# Patient Record
Sex: Female | Born: 1985 | Race: White | Hispanic: No | Smoking: Never smoker
Health system: Southern US, Community
[De-identification: ages and names within clinical notes are randomized; demographics above are authoritative.]

## PROBLEM LIST (undated history)

## (undated) ENCOUNTER — Inpatient Hospital Stay (HOSPITAL_COMMUNITY): Payer: Self-pay

## (undated) DIAGNOSIS — R51 Headache: Secondary | ICD-10-CM

## (undated) DIAGNOSIS — I809 Phlebitis and thrombophlebitis of unspecified site: Secondary | ICD-10-CM

## (undated) DIAGNOSIS — E039 Hypothyroidism, unspecified: Secondary | ICD-10-CM

## (undated) DIAGNOSIS — R519 Headache, unspecified: Secondary | ICD-10-CM

## (undated) DIAGNOSIS — J811 Chronic pulmonary edema: Secondary | ICD-10-CM

## (undated) DIAGNOSIS — I10 Essential (primary) hypertension: Secondary | ICD-10-CM

## (undated) DIAGNOSIS — O1490 Unspecified pre-eclampsia, unspecified trimester: Secondary | ICD-10-CM

---

## 2008-07-06 DIAGNOSIS — O1490 Unspecified pre-eclampsia, unspecified trimester: Secondary | ICD-10-CM

## 2008-07-06 DIAGNOSIS — J810 Acute pulmonary edema: Secondary | ICD-10-CM

## 2014-08-16 ENCOUNTER — Emergency Department: Payer: Self-pay | Admitting: Emergency Medicine

## 2015-05-31 ENCOUNTER — Ambulatory Visit: Payer: 59 | Admitting: Family Medicine

## 2015-07-10 ENCOUNTER — Emergency Department
Admission: EM | Admit: 2015-07-10 | Discharge: 2015-07-10 | Disposition: A | Discharge status: Home / Self Care | Payer: Medicaid Other | Attending: Emergency Medicine | Admitting: Emergency Medicine

## 2015-07-10 ENCOUNTER — Encounter: Payer: Self-pay | Admitting: Emergency Medicine

## 2015-07-10 ENCOUNTER — Emergency Department: Payer: Medicaid Other

## 2015-07-10 DIAGNOSIS — O200 Threatened abortion: Secondary | ICD-10-CM | POA: Diagnosis not present

## 2015-07-10 DIAGNOSIS — O209 Hemorrhage in early pregnancy, unspecified: Secondary | ICD-10-CM | POA: Diagnosis present

## 2015-07-10 DIAGNOSIS — O10011 Pre-existing essential hypertension complicating pregnancy, first trimester: Secondary | ICD-10-CM | POA: Diagnosis not present

## 2015-07-10 DIAGNOSIS — Z3A01 Less than 8 weeks gestation of pregnancy: Secondary | ICD-10-CM | POA: Insufficient documentation

## 2015-07-10 HISTORY — DX: Essential (primary) hypertension: I10

## 2015-07-10 HISTORY — DX: Unspecified pre-eclampsia, unspecified trimester: O14.90

## 2015-07-10 LAB — URINALYSIS COMPLETE WITH MICROSCOPIC (ARMC ONLY)
BACTERIA UA: NONE SEEN
Bilirubin Urine: NEGATIVE
Glucose, UA: NEGATIVE mg/dL
Hgb urine dipstick: NEGATIVE
Ketones, ur: NEGATIVE mg/dL
Leukocytes, UA: NEGATIVE
Nitrite: NEGATIVE
PROTEIN: NEGATIVE mg/dL
Specific Gravity, Urine: 1.019 (ref 1.005–1.030)
pH: 5 (ref 5.0–8.0)

## 2015-07-10 LAB — COMPREHENSIVE METABOLIC PANEL
ALBUMIN: 3.8 g/dL (ref 3.5–5.0)
ALK PHOS: 42 U/L (ref 38–126)
ALT: 30 U/L (ref 14–54)
AST: 26 U/L (ref 15–41)
Anion gap: 7 (ref 5–15)
BUN: 7 mg/dL (ref 6–20)
CHLORIDE: 105 mmol/L (ref 101–111)
CO2: 26 mmol/L (ref 22–32)
CREATININE: 0.67 mg/dL (ref 0.44–1.00)
Calcium: 8.9 mg/dL (ref 8.9–10.3)
GFR calc non Af Amer: >60 mL/min (ref >60)
GLUCOSE: 99 mg/dL (ref 65–99)
Potassium: 3.9 mmol/L (ref 3.5–5.1)
SODIUM: 138 mmol/L (ref 135–145)
Total Bilirubin: 0.3 mg/dL (ref 0.3–1.2)
Total Protein: 6.9 g/dL (ref 6.5–8.1)

## 2015-07-10 LAB — CBC WITH DIFFERENTIAL/PLATELET
Basophils Absolute: 0.1 10*3/uL (ref 0–0.1)
Basophils Relative: 1 %
Eosinophils Absolute: 0.3 10*3/uL (ref 0–0.7)
Eosinophils Relative: 2 %
HEMATOCRIT: 40.3 % (ref 35.0–47.0)
HEMOGLOBIN: 12.9 g/dL (ref 12.0–16.0)
LYMPHS ABS: 3.3 10*3/uL (ref 1.0–3.6)
Lymphocytes Relative: 31 %
MCH: 24.4 pg — AB (ref 26.0–34.0)
MCHC: 32.1 g/dL (ref 32.0–36.0)
MCV: 76.1 fL — ABNORMAL LOW (ref 80.0–100.0)
MONO ABS: 0.5 10*3/uL (ref 0.2–0.9)
MONOS PCT: 5 %
NEUTROS ABS: 6.7 10*3/uL — AB (ref 1.4–6.5)
NEUTROS PCT: 61 %
Platelets: 339 10*3/uL (ref 150–440)
RBC: 5.29 MIL/uL — ABNORMAL HIGH (ref 3.80–5.20)
RDW: 16.1 % — AB (ref 11.5–14.5)
WBC: 11 10*3/uL (ref 3.6–11.0)

## 2015-07-10 LAB — HCG, QUANTITATIVE, PREGNANCY: HCG, BETA CHAIN, QUANT, S: 3949 m[IU]/mL — AB (ref <5)

## 2015-07-10 LAB — ABO/RH: ABO/RH(D): O POS

## 2015-07-10 MED ORDER — ACETAMINOPHEN 500 MG PO TABS
1000.0000 mg | ORAL_TABLET | Freq: Once | ORAL | Status: AC
  Administered 2015-07-10: 1000 mg via ORAL
  Filled 2015-07-10: qty 2

## 2015-07-10 NOTE — ED Notes (Signed)
Pt reports sharp LLQ abdominal pain with spotting that began yesterday. Pt states that her spotting "looks like my period". 3 positive pregnancy tests, last taken at end of November. Nausea, denies vomiting. Pt alert and oriented X4, active, cooperative, pt in NAD. RR even and unlabored, color WNL.

## 2015-07-10 NOTE — Discharge Instructions (Signed)
Please follow-up with the health Department next week for recheck. Return to the emergency department for any significant increase in vaginal bleeding, significant increase in pain, or any other symptom personally concerning to yourself.   Threatened Miscarriage A threatened miscarriage occurs when you have vaginal bleeding during your first 20 weeks of pregnancy but the pregnancy has not ended. If you have vaginal bleeding during this time, your health care provider will do tests to make sure you are still pregnant. If the tests show you are still pregnant and the developing baby (fetus) inside your womb (uterus) is still growing, your condition is considered a threatened miscarriage. A threatened miscarriage does not mean your pregnancy will end, but it does increase the risk of losing your pregnancy (complete miscarriage). CAUSES  The cause of a threatened miscarriage is usually not known. If you go on to have a complete miscarriage, the most common cause is an abnormal number of chromosomes in the developing baby. Chromosomes are the structures inside cells that hold all your genetic material. Some causes of vaginal bleeding that do not result in miscarriage include:  Having sex.  Having an infection.  Normal hormone changes of pregnancy.  Bleeding that occurs when an egg implants in your uterus. RISK FACTORS Risk factors for bleeding in early pregnancy include:  Obesity.  Smoking.  Drinking excessive amounts of alcohol or caffeine.  Recreational drug use. SIGNS AND SYMPTOMS  Light vaginal bleeding.  Mild abdominal pain or cramps. DIAGNOSIS  If you have bleeding with or without abdominal pain before 20 weeks of pregnancy, your health care provider will do tests to check whether you are still pregnant. One important test involves using sound waves and a computer (ultrasound) to create images of the inside of your uterus. Other tests include an internal exam of your vagina and  uterus (pelvic exam) and measurement of your baby's heart rate.  You may be diagnosed with a threatened miscarriage if:  Ultrasound testing shows you are still pregnant.  Your baby's heart rate is strong.  A pelvic exam shows that the opening between your uterus and your vagina (cervix) is closed.  Your heart rate and blood pressure are stable.  Blood tests confirm you are still pregnant. TREATMENT  No treatments have been shown to prevent a threatened miscarriage from going on to a complete miscarriage. However, the right home care is important.  HOME CARE INSTRUCTIONS   Make sure you keep all your appointments for prenatal care. This is very important.  Get plenty of rest.  Do not have sex or use tampons if you have vaginal bleeding.  Do not douche.  Do not smoke or use recreational drugs.  Do not drink alcohol.  Avoid caffeine. SEEK MEDICAL CARE IF:  You have light vaginal bleeding or spotting while pregnant.  You have abdominal pain or cramping.  You have a fever. SEEK IMMEDIATE MEDICAL CARE IF:  You have heavy vaginal bleeding.  You have blood clots coming from your vagina.  You have severe low back pain or abdominal cramps.  You have fever, chills, and severe abdominal pain. MAKE SURE YOU:  Understand these instructions.  Will watch your condition.  Will get help right away if you are not doing well or get worse.   This information is not intended to replace advice given to you by your health care provider. Make sure you discuss any questions you have with your health care provider.   Document Released: 07/14/2005 Document Revised: 07/19/2013 Document Reviewed:  05/10/2013 Elsevier Interactive Patient Education [employer]2016 Elsevier Inc.

## 2015-07-10 NOTE — ED Notes (Signed)
Pt states her last period was mid September, has had a previous ectopic preg in past. Concerned for the same.

## 2015-07-10 NOTE — ED Provider Notes (Signed)
[facility] Emergency Department Provider Note  Time seen: 10:16 AM  I have reviewed the triage vital signs and the nursing notes.   HISTORY  Chief Complaint Vaginal Bleeding    HPI Stephanie [name] is a 29 y.o. female G4 P2 A1 with a past medical history of hypertension, past history of preeclampsia, thyroid disease, who presents the emergency department with left lower quadrant abdominal pain and vaginal spotting. According to the patient her last period was approximately 2 months ago, she took 3 pregnancy tests at home starting at the end of November which have all been positive.Denies fever, nausea, vomiting, diarrhea, dysuria. States she had very slight vaginal spotting this morning. States a history of an ectopic pregnancy in the past. Describes her cramping left lower quads abdominal pain as mild currently.     Past Medical History  Diagnosis Date  . Ectopic pregnancy, tubal     left   . Hypertension   . Preeclampsia     There are no active problems to display for this patient.   Past Surgical History  Procedure Laterality Date  . Cesarean section      No current outpatient prescriptions on file.  Allergies Review of patient's allergies indicates no known allergies.  No family history on file.  Social History Social History  Substance Use Topics  . Smoking status: Never Smoker   . Smokeless tobacco: None  . Alcohol Use: No    Review of Systems Constitutional: Negative for fever. Cardiovascular: Negative for chest pain. Respiratory: Negative for shortness of breath. Gastrointestinal: Positive left lower quadrant abdominal pain. Negative for nausea, vomiting, diarrhea Genitourinary: Negative for dysuria. Positive for vaginal spotting Musculoskeletal: Negative for back pain. Skin: Negative for rash. Neurological: Negative for headache 10-point ROS otherwise negative.  ____________________________________________   PHYSICAL  EXAM:  VITAL SIGNS: ED Triage Vitals  Enc Vitals Group     BP 07/10/15 0926 151/85 mmHg     Pulse Rate 07/10/15 0926 82     Resp 07/10/15 0925 18     Temp 07/10/15 0925 98.5 F (36.9 C)     Temp Source 07/10/15 0925 Oral     SpO2 07/10/15 0926 98 %     Weight 07/10/15 0926 240 lb (108.863 kg)     Height 07/10/15 0926  (1.676 m)     Head Cir --      Peak Flow --      Pain Score 07/10/15 0927 7     Pain Loc --      Pain Edu? --      Excl. in GC? --     Constitutional: Alert and oriented. Well appearing and in no distress. Eyes: Normal exam ENT   Head: Normocephalic and atraumatic   Mouth/Throat: Mucous membranes are moist. Cardiovascular: Normal rate, regular rhythm. No murmur Respiratory: Normal respiratory effort without tachypnea nor retractions. Breath sounds are clear and equal bilaterally. No wheezes/rales/rhonchi. Gastrointestinal: Soft, mild diffuse lower abdominal tenderness palpation, left greater than right. No rebound or guarding. No distention. Musculoskeletal: Nontender with normal range of motion in all extremities. Neurologic:  Normal speech and language. No gross focal neurologic deficits  Skin:  Skin is warm, dry and intact.  Psychiatric: Mood and affect are normal. Speech and behavior are normal.  ____________________________________________   RADIOLOGY  Ultrasound consistent with 5 week 2 day gestational sac.  ____________________________________________    INITIAL IMPRESSION / ASSESSMENT AND PLAN / ED COURSE  Pertinent labs & imaging results that  were available during my care of the patient were reviewed by me and considered in my medical decision making (see chart for details).  Patient resents the emergency department left lower quadrant abdominal cramping, vaginal spotting, proximally [redacted] weeks pregnant by LMP. Minimal tenderness to exam, we will check labs, obtain an ultrasound, and closely monitor in the emergency  department.  Ultrasound showing 5 week 2 day gestational sac, labs within normal limits with a beta hCG of 3000. Likely early pregnancy however cannot rule out early miscarriage. Patient is a follow-up with the health department for recheck. I discussed strict return precautions for increased vaginal bleeding abdominal pain, patient is agreeable.  ____________________________________________   FINAL CLINICAL IMPRESSION(S) / ED DIAGNOSES  Threatened abortion   Minna AntisKevin Chayna Surratt, MD 07/10/15 1335

## 2015-07-10 NOTE — ED Notes (Signed)
Lab is currently running HCG at this time.

## 2015-07-10 NOTE — ED Notes (Signed)
Cramping on left lower abd, spotting for a few days now.

## 2015-09-27 ENCOUNTER — Other Ambulatory Visit (HOSPITAL_COMMUNITY): Payer: Self-pay | Admitting: Obstetrics and Gynecology

## 2015-09-27 DIAGNOSIS — Z3A18 18 weeks gestation of pregnancy: Secondary | ICD-10-CM

## 2015-09-27 DIAGNOSIS — Z3689 Encounter for other specified antenatal screening: Secondary | ICD-10-CM

## 2015-09-27 DIAGNOSIS — O280 Abnormal hematological finding on antenatal screening of mother: Secondary | ICD-10-CM

## 2015-10-04 ENCOUNTER — Encounter (HOSPITAL_COMMUNITY): Payer: Self-pay

## 2015-10-04 ENCOUNTER — Other Ambulatory Visit (HOSPITAL_COMMUNITY): Payer: Self-pay | Admitting: Obstetrics and Gynecology

## 2015-10-04 ENCOUNTER — Ambulatory Visit (HOSPITAL_COMMUNITY): Admission: RE | Admit: 2015-10-04 | Payer: Medicaid Other | Source: Ambulatory Visit

## 2015-10-04 ENCOUNTER — Other Ambulatory Visit (HOSPITAL_COMMUNITY): Payer: Self-pay | Admitting: *Deleted

## 2015-10-04 ENCOUNTER — Ambulatory Visit (HOSPITAL_COMMUNITY)
Admission: RE | Admit: 2015-10-04 | Discharge: 2015-10-04 | Disposition: A | Discharge status: Home / Self Care | Payer: Medicaid Other | Source: Ambulatory Visit | Attending: Obstetrics and Gynecology | Admitting: Obstetrics and Gynecology

## 2015-10-04 DIAGNOSIS — O280 Abnormal hematological finding on antenatal screening of mother: Secondary | ICD-10-CM

## 2015-10-04 DIAGNOSIS — Z3A18 18 weeks gestation of pregnancy: Secondary | ICD-10-CM

## 2015-10-04 DIAGNOSIS — O99212 Obesity complicating pregnancy, second trimester: Secondary | ICD-10-CM | POA: Diagnosis not present

## 2015-10-04 DIAGNOSIS — O09212 Supervision of pregnancy with history of pre-term labor, second trimester: Secondary | ICD-10-CM | POA: Diagnosis not present

## 2015-10-04 DIAGNOSIS — O283 Abnormal ultrasonic finding on antenatal screening of mother: Secondary | ICD-10-CM | POA: Diagnosis not present

## 2015-10-04 DIAGNOSIS — IMO0002 Reserved for concepts with insufficient information to code with codable children: Secondary | ICD-10-CM

## 2015-10-04 DIAGNOSIS — Z0489 Encounter for examination and observation for other specified reasons: Secondary | ICD-10-CM

## 2015-10-04 DIAGNOSIS — Z1389 Encounter for screening for other disorder: Secondary | ICD-10-CM

## 2015-10-04 DIAGNOSIS — O09292 Supervision of pregnancy with other poor reproductive or obstetric history, second trimester: Secondary | ICD-10-CM | POA: Insufficient documentation

## 2015-10-04 DIAGNOSIS — O4400 Placenta previa specified as without hemorrhage, unspecified trimester: Secondary | ICD-10-CM

## 2015-10-04 DIAGNOSIS — Z3689 Encounter for other specified antenatal screening: Secondary | ICD-10-CM

## 2015-10-04 HISTORY — DX: Hypothyroidism, unspecified: E03.9

## 2015-10-05 ENCOUNTER — Other Ambulatory Visit (HOSPITAL_COMMUNITY): Payer: Self-pay | Admitting: *Deleted

## 2015-10-05 DIAGNOSIS — O4400 Placenta previa specified as without hemorrhage, unspecified trimester: Secondary | ICD-10-CM

## 2015-11-01 ENCOUNTER — Ambulatory Visit (HOSPITAL_COMMUNITY): Payer: Medicaid Other

## 2015-11-28 ENCOUNTER — Encounter (HOSPITAL_COMMUNITY): Payer: Self-pay | Admitting: *Deleted

## 2015-11-28 ENCOUNTER — Observation Stay (HOSPITAL_COMMUNITY)
Admission: AD | Admit: 2015-11-28 | Discharge: 2015-11-29 | Disposition: A | Discharge status: Home / Self Care | Payer: Medicaid Other | Source: Ambulatory Visit | Attending: Obstetrics and Gynecology | Admitting: Obstetrics and Gynecology

## 2015-11-28 DIAGNOSIS — O99283 Endocrine, nutritional and metabolic diseases complicating pregnancy, third trimester: Secondary | ICD-10-CM | POA: Insufficient documentation

## 2015-11-28 DIAGNOSIS — Z283 Underimmunization status: Secondary | ICD-10-CM | POA: Insufficient documentation

## 2015-11-28 DIAGNOSIS — I8001 Phlebitis and thrombophlebitis of superficial vessels of right lower extremity: Secondary | ICD-10-CM | POA: Diagnosis not present

## 2015-11-28 DIAGNOSIS — G2581 Restless legs syndrome: Secondary | ICD-10-CM | POA: Diagnosis not present

## 2015-11-28 DIAGNOSIS — E0590 Thyrotoxicosis, unspecified without thyrotoxic crisis or storm: Secondary | ICD-10-CM

## 2015-11-28 DIAGNOSIS — O99213 Obesity complicating pregnancy, third trimester: Secondary | ICD-10-CM | POA: Diagnosis not present

## 2015-11-28 DIAGNOSIS — O2223 Superficial thrombophlebitis in pregnancy, third trimester: Principal | ICD-10-CM | POA: Insufficient documentation

## 2015-11-28 DIAGNOSIS — Z8669 Personal history of other diseases of the nervous system and sense organs: Secondary | ICD-10-CM

## 2015-11-28 DIAGNOSIS — Z3A28 28 weeks gestation of pregnancy: Secondary | ICD-10-CM | POA: Diagnosis not present

## 2015-11-28 DIAGNOSIS — O99353 Diseases of the nervous system complicating pregnancy, third trimester: Secondary | ICD-10-CM | POA: Diagnosis not present

## 2015-11-28 DIAGNOSIS — E669 Obesity, unspecified: Secondary | ICD-10-CM

## 2015-11-28 DIAGNOSIS — O4413 Placenta previa with hemorrhage, third trimester: Secondary | ICD-10-CM | POA: Insufficient documentation

## 2015-11-28 DIAGNOSIS — Z8759 Personal history of other complications of pregnancy, childbirth and the puerperium: Secondary | ICD-10-CM

## 2015-11-28 DIAGNOSIS — Z2839 Other underimmunization status: Secondary | ICD-10-CM

## 2015-11-28 DIAGNOSIS — O9989 Other specified diseases and conditions complicating pregnancy, childbirth and the puerperium: Secondary | ICD-10-CM | POA: Diagnosis present

## 2015-11-28 DIAGNOSIS — E876 Hypokalemia: Secondary | ICD-10-CM | POA: Diagnosis not present

## 2015-11-28 DIAGNOSIS — Z8719 Personal history of other diseases of the digestive system: Secondary | ICD-10-CM

## 2015-11-28 DIAGNOSIS — Z98891 History of uterine scar from previous surgery: Secondary | ICD-10-CM

## 2015-11-28 DIAGNOSIS — I8000 Phlebitis and thrombophlebitis of superficial vessels of unspecified lower extremity: Secondary | ICD-10-CM | POA: Diagnosis present

## 2015-11-28 HISTORY — DX: Chronic pulmonary edema: J81.1

## 2015-11-28 LAB — URIC ACID: Uric Acid, Serum: 4.7 mg/dL (ref 2.3–6.6)

## 2015-11-28 LAB — URINALYSIS, ROUTINE W REFLEX MICROSCOPIC
BILIRUBIN URINE: NEGATIVE
GLUCOSE, UA: NEGATIVE mg/dL
Hgb urine dipstick: NEGATIVE
KETONES UR: NEGATIVE mg/dL
LEUKOCYTES UA: NEGATIVE
Nitrite: NEGATIVE
PH: 5.5 (ref 5.0–8.0)
Protein, ur: NEGATIVE mg/dL
Specific Gravity, Urine: 1.02 (ref 1.005–1.030)

## 2015-11-28 LAB — COMPREHENSIVE METABOLIC PANEL
ALK PHOS: 53 U/L (ref 38–126)
ALT: 9 U/L — ABNORMAL LOW (ref 14–54)
ANION GAP: 9 (ref 5–15)
AST: 11 U/L — ABNORMAL LOW (ref 15–41)
Albumin: 2.9 g/dL — ABNORMAL LOW (ref 3.5–5.0)
BUN: 7 mg/dL (ref 6–20)
CALCIUM: 8.8 mg/dL — AB (ref 8.9–10.3)
CO2: 24 mmol/L (ref 22–32)
Chloride: 104 mmol/L (ref 101–111)
Creatinine, Ser: 0.45 mg/dL (ref 0.44–1.00)
Glucose, Bld: 93 mg/dL (ref 65–99)
Potassium: 3.2 mmol/L — ABNORMAL LOW (ref 3.5–5.1)
SODIUM: 137 mmol/L (ref 135–145)
TOTAL PROTEIN: 7.1 g/dL (ref 6.5–8.1)
Total Bilirubin: 0.3 mg/dL (ref 0.3–1.2)

## 2015-11-28 LAB — PROTEIN / CREATININE RATIO, URINE
CREATININE, URINE: 83 mg/dL
Protein Creatinine Ratio: 0.17 mg/mg{Cre} — ABNORMAL HIGH (ref 0.00–0.15)
TOTAL PROTEIN, URINE: 14 mg/dL

## 2015-11-28 LAB — LACTATE DEHYDROGENASE: LDH: 101 U/L (ref 98–192)

## 2015-11-28 LAB — CBC
HCT: 34.2 % — ABNORMAL LOW (ref 36.0–46.0)
HEMOGLOBIN: 11.3 g/dL — AB (ref 12.0–15.0)
MCH: 26.7 pg (ref 26.0–34.0)
MCHC: 33 g/dL (ref 30.0–36.0)
MCV: 80.9 fL (ref 78.0–100.0)
Platelets: 313 10*3/uL (ref 150–400)
RBC: 4.23 MIL/uL (ref 3.87–5.11)
RDW: 14.1 % (ref 11.5–15.5)
WBC: 12.7 10*3/uL — ABNORMAL HIGH (ref 4.0–10.5)

## 2015-11-28 MED ORDER — PANTOPRAZOLE SODIUM 40 MG PO TBEC
40.0000 mg | DELAYED_RELEASE_TABLET | Freq: Every day | ORAL | Status: DC
  Administered 2015-11-29 (×2): 40 mg via ORAL
  Filled 2015-11-28 (×3): qty 1

## 2015-11-28 MED ORDER — IBUPROFEN 600 MG PO TABS: 600.0000 mg | ORAL_TABLET | Freq: Four times a day (QID) | ORAL | Status: DC | PRN

## 2015-11-28 MED ORDER — ACETAMINOPHEN 325 MG PO TABS: 650.0000 mg | ORAL_TABLET | ORAL | Status: DC | PRN

## 2015-11-28 MED ORDER — CALCIUM CARBONATE ANTACID 500 MG PO CHEW: 2.0000 | CHEWABLE_TABLET | ORAL | Status: DC | PRN

## 2015-11-28 MED ORDER — DOCUSATE SODIUM 100 MG PO CAPS
100.0000 mg | ORAL_CAPSULE | Freq: Every day | ORAL | Status: DC
  Administered 2015-11-29: 100 mg via ORAL
  Filled 2015-11-28: qty 1

## 2015-11-28 MED ORDER — ZOLPIDEM TARTRATE 5 MG PO TABS: 5.0000 mg | ORAL_TABLET | Freq: Every evening | ORAL | Status: DC | PRN

## 2015-11-28 MED ORDER — PRENATAL MULTIVITAMIN CH: 1.0000 | ORAL_TABLET | Freq: Every day | ORAL | Status: DC
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
## 2015-11-28 NOTE — Progress Notes (Signed)
Spoke with Dr. Hart RochesterLawson, Vascular.  Informed that there are not technicians overnight to perform lower extremity doppler.  No antibiotics recommended just heat and anti-inflammatory medication.  Would not recommend Lovenox if only superficial thrombophlebitis.

## 2015-11-28 NOTE — MAU Note (Signed)
Pt c/o varicose vein pain two days ago, today became progressively more painful, warm to touch, swollen and painful to  Bare weight.  Called [facility] and was told to come in.

## 2015-11-29 ENCOUNTER — Observation Stay (HOSPITAL_BASED_OUTPATIENT_CLINIC_OR_DEPARTMENT_OTHER): Payer: Medicaid Other

## 2015-11-29 ENCOUNTER — Ambulatory Visit (HOSPITAL_COMMUNITY): Admission: RE | Admit: 2015-11-29 | Payer: Medicaid Other | Source: Ambulatory Visit

## 2015-11-29 DIAGNOSIS — O9989 Other specified diseases and conditions complicating pregnancy, childbirth and the puerperium: Secondary | ICD-10-CM

## 2015-11-29 DIAGNOSIS — Z8719 Personal history of other diseases of the digestive system: Secondary | ICD-10-CM

## 2015-11-29 DIAGNOSIS — I8001 Phlebitis and thrombophlebitis of superficial vessels of right lower extremity: Secondary | ICD-10-CM

## 2015-11-29 DIAGNOSIS — E0590 Thyrotoxicosis, unspecified without thyrotoxic crisis or storm: Secondary | ICD-10-CM

## 2015-11-29 DIAGNOSIS — Z283 Underimmunization status: Secondary | ICD-10-CM

## 2015-11-29 DIAGNOSIS — G2581 Restless legs syndrome: Secondary | ICD-10-CM

## 2015-11-29 DIAGNOSIS — E876 Hypokalemia: Secondary | ICD-10-CM

## 2015-11-29 DIAGNOSIS — E669 Obesity, unspecified: Secondary | ICD-10-CM

## 2015-11-29 DIAGNOSIS — I8000 Phlebitis and thrombophlebitis of superficial vessels of unspecified lower extremity: Secondary | ICD-10-CM | POA: Diagnosis present

## 2015-11-29 DIAGNOSIS — Z8669 Personal history of other diseases of the nervous system and sense organs: Secondary | ICD-10-CM

## 2015-11-29 DIAGNOSIS — Z98891 History of uterine scar from previous surgery: Secondary | ICD-10-CM

## 2015-11-29 DIAGNOSIS — O09899 Supervision of other high risk pregnancies, unspecified trimester: Secondary | ICD-10-CM

## 2015-11-29 DIAGNOSIS — Z8759 Personal history of other complications of pregnancy, childbirth and the puerperium: Secondary | ICD-10-CM

## 2015-11-29 LAB — TYPE AND SCREEN
ABO/RH(D): O POS
ANTIBODY SCREEN: NEGATIVE

## 2015-11-29 LAB — ABO/RH: ABO/RH(D): O POS

## 2015-11-29 MED ORDER — ASPIRIN EC 81 MG PO TBEC
81.0000 mg | DELAYED_RELEASE_TABLET | Freq: Every day | ORAL | Status: DC
  Administered 2015-11-29: 81 mg via ORAL
  Filled 2015-11-29 (×2): qty 1

## 2015-11-29 MED ORDER — POTASSIUM CHLORIDE CRYS ER 20 MEQ PO TBCR
20.0000 meq | EXTENDED_RELEASE_TABLET | Freq: Two times a day (BID) | ORAL | Status: DC
  Administered 2015-11-29 (×2): 20 meq via ORAL
  Filled 2015-11-29 (×4): qty 1

## 2015-11-29 NOTE — Progress Notes (Signed)
VASCULAR LAB PRELIMINARY  PRELIMINARY  PRELIMINARY  PRELIMINARY  Bilateral lower extremity venous duplex completed.     Bilateral:  No evidence of DVT, or Baker's Cyst.  Evidence of superficial acute thrombus branch from the greater saphenous vein at the level of the knee.  Gave results to the patient's nurse@ 9:00 am.  Jenetta Logesami Azaylah Stailey, RVT, RDMS 11/29/2015, 9:17 AM

## 2015-11-29 NOTE — Discharge Summary (Signed)
Physician Discharge Summary  Patient ID: Stephanie [name] MRN: [id number] DOB/AGE: 28/10/1985 28 y.o.  Admit date: 11/28/2015 Discharge date: 11/29/2015  Admission Diagnoses: Leg Pain  Discharge Diagnoses:  Principal Problem:   Thrombophlebitis leg superficial Active Problems:   Placenta previa   H/O: C-section   H/O migraine (no meds)   Obesity (BMI 30-39.9)   Rubella non-immune status, antepartum   Hypokalemia   H/O gastroesophageal reflux (GERD)   Restless leg syndrome   Discharged Condition: good  Hospital Course: Patient was observed in hospital until doppler study obtained which was negative for DVT.  The report showed:  Bilateral lower extremity venous duplex completed.   Bilateral: No evidence of DVT, or Baker's Cyst.  Evidence of superficial acute thrombus branch from the greater saphenous vein at the level of the knee.  Patient was instructed to use warm compresses over the area, use Ted stockings and take ibuprofen for the next 24 - 48 hours then as needed but do not take after 28wks.  Pt verbalized understanding and pt instructed to rto next week for f/u.  FHR 145 day of discharge.  Consults: None  Significant Diagnostic Studies: Doppler as above  Treatments: Ted Hose, Warm Compress and NSAID  Discharge Exam: Blood pressure 112/56, pulse 69, temperature 98.3 F (36.8 C), temperature source Oral, resp. rate 18, height 5\' 6"  (1.676 m), weight 108.863 kg (240 lb), last menstrual period 05/21/2015, SpO2 99 %. General appearance: alert and no distress Resp: clear to auscultation bilaterally CV: RRR Extremities: area of concern is less red and less tender, ted stockings are on  Disposition: 01-Home or Self Care     Medication List    TAKE these medications        acetaminophen 325 MG tablet  Commonly known as:  TYLENOL  Take 650 mg by mouth every 6 (six) hours as needed for mild pain or headache.     prenatal multivitamin Tabs tablet  Take 1 tablet  by mouth daily at 12 noon.           Follow-up Information    Follow up with [facility] In 1 week.   Specialty:  Obstetrics and Gynecology   Why:  f/u in the office next week.  Someone from the office will be giving pt a call before discharge.   Contact information:   [address] [place] [id number] [phone]       Signed: Purcell NailsROBERTS,Shaynah Hund Y 11/29/2015, 11:20 AM

## 2015-11-29 NOTE — Progress Notes (Signed)
Pt. Is discharged in the care of friend. Denies any pain or discomfort.  States rt. Leg pain is getting better. FHT 145.Marland Kitchen.  Understands all discharged instructions well . Questions asked and answered. Stable.

## 2015-11-29 NOTE — Discharge Instructions (Signed)
Phlebitis  Phlebitis is soreness and swelling (inflammation) of a vein. This can occur in your arms, legs, or torso (trunk), as well as deeper inside your body. Phlebitis is usually not serious when it occurs close to the surface of the body. However, it can cause serious problems when it occurs in a vein deeper inside the body.  CAUSES   Phlebitis can be triggered by various things, including:    Reduced blood flow through your veins. This can happen with:    Bed rest over a long period.    Long-distance travel.    Injury.    Surgery.    Being overweight (obese) or pregnant.   Having an IV tube put in the vein and getting certain medicines through the vein.   Cancer and cancer treatment.   Use of illegal drugs taken through the vein.   Inflammatory diseases.   Inherited (genetic) diseases that increase the risk of blood clots.   Hormone therapy, such as birth control pills.  SIGNS AND SYMPTOMS    Red, tender, swollen, and painful area on your skin. Usually, the area will be long and narrow.   Firmness along the center of the affected area. This can indicate that a blood clot has formed.   Low-grade fever.  DIAGNOSIS   A health care provider can usually diagnose phlebitis by examining the affected area and asking about your symptoms. To check for infection or blood clots, your health care provider may order blood tests or an ultrasound exam of the area. Blood tests and your family history may also indicate if you have an underlying genetic disease that causes blood clots. Occasionally, a piece of tissue is taken from the body (biopsy sample) if an unusual cause of phlebitis is suspected.  TREATMENT   Treatment will vary depending on the severity of the condition and the area of the body affected. Treatment may include:   Use of a warm compress or heating pad.   Use of compression stockings or bandages.   Anti-inflammatory medicines.   Removal of any IV tube that may be causing the problem.   Medicines  that kill germs (antibiotics) if an infection is present.   Blood-thinning medicines if a blood clot is suspected or present.   In rare cases, surgery may be needed to remove damaged sections of vein.  HOME CARE INSTRUCTIONS    Only take over-the-counter or prescription medicines as directed by your health care provider. Take all medicines exactly as prescribed.   Raise (elevate) the affected area above the level of your heart as directed by your health care provider.   Apply a warm compress or heating pad to the affected area as directed by your health care provider. Do not sleep with the heating pad.   Use compression stockings or bandages as directed. These will speed healing and prevent the condition from coming back.   If you are on blood thinners:    Get follow-up blood tests as directed by your health care provider.    Check with your health care provider before using any new medicines.    Carry a medical alert card or wear your medical alert jewelry to show that you are on blood thinners.   For phlebitis in the legs:    Avoid prolonged standing or bed rest.    Keep your legs moving. Raise your legs when sitting or lying.   Do not smoke.   Women, particularly those over the age of 35, should consider   the risks and benefits of taking the contraceptive pill. This kind of hormone treatment can increase your risk for blood clots.   Follow up with your health care provider as directed.  SEEK MEDICAL CARE IF:    You have unusual bruising or any bleeding problems.   Your swelling or pain in the affected area is not improving.   You are on anti-inflammatory medicine, and you develop belly (abdominal) pain.  SEEK IMMEDIATE MEDICAL CARE IF:    You have a sudden onset of chest pain or difficulty breathing.   You have a fever or persistent symptoms for more than 2-3 days.   You have a fever and your symptoms suddenly get worse.  MAKE SURE YOU:   Understand these instructions.   Will watch your  condition.   Will get help right away if you are not doing well or get worse.     This information is not intended to replace advice given to you by your health care provider. Make sure you discuss any questions you have with your health care provider.     Document Released: 07/08/2001 Document Revised: 05/04/2013 Document Reviewed: 03/21/2013  [employer]vier Interactive Patient Education 2016 [employer]

## 2015-12-06 ENCOUNTER — Emergency Department
Admission: EM | Admit: 2015-12-06 | Discharge: 2015-12-07 | Discharge status: Against Medical Advice | Payer: Medicaid Other | Attending: Emergency Medicine | Admitting: Emergency Medicine

## 2015-12-06 DIAGNOSIS — E039 Hypothyroidism, unspecified: Secondary | ICD-10-CM | POA: Diagnosis not present

## 2015-12-06 DIAGNOSIS — I8001 Phlebitis and thrombophlebitis of superficial vessels of right lower extremity: Secondary | ICD-10-CM | POA: Diagnosis not present

## 2015-12-06 DIAGNOSIS — Z6839 Body mass index (BMI) 39.0-39.9, adult: Secondary | ICD-10-CM | POA: Diagnosis not present

## 2015-12-06 DIAGNOSIS — I10 Essential (primary) hypertension: Secondary | ICD-10-CM | POA: Insufficient documentation

## 2015-12-06 DIAGNOSIS — Z79899 Other long term (current) drug therapy: Secondary | ICD-10-CM | POA: Insufficient documentation

## 2015-12-06 DIAGNOSIS — E669 Obesity, unspecified: Secondary | ICD-10-CM | POA: Insufficient documentation

## 2015-12-06 DIAGNOSIS — M79651 Pain in right thigh: Secondary | ICD-10-CM

## 2015-12-06 DIAGNOSIS — R0600 Dyspnea, unspecified: Secondary | ICD-10-CM | POA: Diagnosis not present

## 2015-12-06 NOTE — ED Notes (Addendum)
Pt in with co shob and increasing right leg pain dx with dvt last week and was hospitalized for the same. States was not discharged with blood thinners pt is also [redacted] weeks pregnant.

## 2015-12-07 ENCOUNTER — Emergency Department: Payer: Medicaid Other

## 2015-12-07 LAB — CBC WITH DIFFERENTIAL/PLATELET
BASOS ABS: 0.1 10*3/uL (ref 0–0.1)
Basophils Relative: 1 %
Eosinophils Absolute: 0.1 10*3/uL (ref 0–0.7)
HEMATOCRIT: 33.3 % — AB (ref 35.0–47.0)
Hemoglobin: 10.9 g/dL — ABNORMAL LOW (ref 12.0–16.0)
LYMPHS ABS: 3.3 10*3/uL (ref 1.0–3.6)
MCH: 26 pg (ref 26.0–34.0)
MCHC: 32.8 g/dL (ref 32.0–36.0)
MCV: 79.2 fL — AB (ref 80.0–100.0)
Monocytes Absolute: 0.8 10*3/uL (ref 0.2–0.9)
Monocytes Relative: 6 %
NEUTROS ABS: 9.8 10*3/uL — AB (ref 1.4–6.5)
Neutrophils Relative %: 69 %
PLATELETS: 308 10*3/uL (ref 150–440)
RBC: 4.2 MIL/uL (ref 3.80–5.20)
RDW: 14.3 % (ref 11.5–14.5)
WBC: 14.2 10*3/uL — AB (ref 3.6–11.0)

## 2015-12-07 LAB — BASIC METABOLIC PANEL
ANION GAP: 5 (ref 5–15)
BUN: 8 mg/dL (ref 6–20)
CHLORIDE: 108 mmol/L (ref 101–111)
CO2: 25 mmol/L (ref 22–32)
Calcium: 8.6 mg/dL — ABNORMAL LOW (ref 8.9–10.3)
Creatinine, Ser: 0.5 mg/dL (ref 0.44–1.00)
GFR calc Af Amer: >60 mL/min (ref >60)
GLUCOSE: 121 mg/dL — AB (ref 65–99)
POTASSIUM: 3 mmol/L — AB (ref 3.5–5.1)
SODIUM: 138 mmol/L (ref 135–145)

## 2015-12-07 LAB — FIBRIN DERIVATIVES D-DIMER (ARMC ONLY): Fibrin derivatives D-dimer (ARMC): 758 — ABNORMAL HIGH (ref 0–499)

## 2015-12-07 NOTE — ED Notes (Signed)
Charge RN in patient room to notify pt a 20 G IV is needed for procedure, patient stated "no I already have one and it took her 2 tries to get this one." Consulting civil engineerCharge RN discussed with pt the need to have a larger IV, pt refusing to attempt IV placement. Dr. Scotty CourtStafford notified. MD in room discussing with pt of treatment plan for CT, pt refusing to have procedure performed to MD.

## 2015-12-07 NOTE — ED Notes (Signed)
Pt presents to ED with c/o right leg pain since this morning. Pt reports was dx with DVT last week, reports was not prescribed blood thinners when discharged. Pt reports calf feels like its cramping and pain radiated to inner thigh of right leg. Pt is [redacted] weeks pregnant. Pt alert and oriented x 4, no increased work in breathing noted. Skin warm and dry. Call bell within reach.

## 2015-12-07 NOTE — ED Notes (Signed)

## 2015-12-07 NOTE — ED Provider Notes (Signed)
[facility] Emergency Department Provider Note  ____________________________________________  Time seen: 11:45 PM  I have reviewed the triage vital signs and the nursing notes.   HISTORY  Chief Complaint Shortness of Breath    HPI Stephanie [name] is a 30 y.o. female who complains of shortness of breath and right leg pain. The shortness of breath started today, no aggravating or alleviating factors. No chest pain. Not worse with exertion but not better with rest.  She was recently hospitalized for anticoagulation due to concern for venous thrombosis in the right leg. 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 She was discharged home without any anticoagulation at that time. However, over the past week since she's been home, she has had worsening pain in the right leg extending up to the groin area. She also noticed some increasing swelling and pain below the knee as well. Denies fevers or chills, no vomiting of otherwise eating and drinking normally and feeling well. Has a history of placenta previa, no vaginal bleeding. Following up closely with obstetrics.     Past Medical History  Diagnosis Date  . Ectopic pregnancy, tubal     left   . Hypertension   . Preeclampsia   . Hypothyroidism   . Pulmonary edema     third trimester     Patient Active Problem List   Diagnosis Date Noted  . Thrombophlebitis leg superficial 11/29/2015  . Placenta previa 11/29/2015  . H/O: C-section 11/29/2015  . H/O migraine (no meds) 11/29/2015  . Obesity (BMI 30-39.9) 11/29/2015  . Rubella non-immune status, antepartum 11/29/2015  . Hypokalemia 11/29/2015  . H/O gastroesophageal reflux (GERD) 11/29/2015  . Restless leg syndrome 11/29/2015     Past Surgical History  Procedure Laterality Date  . Cesarean section       Current Outpatient Rx  Name  Route  Sig  Dispense  Refill   . acetaminophen (TYLENOL) 325 MG tablet   Oral   Take 650 mg by mouth every 6 (six) hours as needed for mild pain or headache.         . Prenatal Vit-Fe Fumarate-FA (PRENATAL MULTIVITAMIN) TABS tablet   Oral   Take 1 tablet by mouth daily at 12 noon.            Allergies Review of patient's allergies indicates no known allergies.   No family history on file.  Social History Social History  Substance Use Topics  . Smoking status: Never Smoker   . Smokeless tobacco: Not on file  . Alcohol Use: No    Review of Systems  Constitutional:   No fever or chills.  Eyes:   No vision changes.  ENT:   No sore throat. No rhinorrhea. Cardiovascular:   No chest pain. Respiratory:   No dyspnea or cough. Gastrointestinal:   Negative for abdominal pain, vomiting and diarrhea.  Genitourinary:   Negative for dysuria or difficulty urinating. Musculoskeletal:   Right thigh pain  Neurological:   Negative for headaches 10-point ROS otherwise negative.  ____________________________________________   PHYSICAL EXAM:  VITAL SIGNS: ED Triage Vitals  Enc Vitals Group     BP 12/06/15 2326 139/86 mmHg     Pulse Rate 12/06/15 2326 88     Resp 12/06/15 2326 18     Temp 12/06/15 2326 97.6 F (36.4 C)     Temp Source 12/06/15 2326 Oral     SpO2 12/06/15  2326 97 %     Weight 12/06/15 2326 246 lb (111.585 kg)     Height 12/06/15 2326 5\' 6"  (1.676 m)     Head Cir --      Peak Flow --      Pain Score 12/06/15 2326 8     Pain Loc --      Pain Edu? --      Excl. in GC? --     Vital signs reviewed, nursing assessments reviewed.   Constitutional:   Alert and oriented. Well appearing and in no distress. Eyes:   No scleral icterus. No conjunctival pallor. PERRL. EOMI.  No nystagmus. ENT   Head:   Normocephalic and atraumatic.   Nose:   No congestion/rhinnorhea. No septal hematoma   Mouth/Throat:   MMM, no pharyngeal erythema. No peritonsillar mass.    Neck:   No stridor. No  SubQ emphysema. No meningismus. Hematological/Lymphatic/Immunilogical:   No cervical lymphadenopathy. Cardiovascular:   RRR. Symmetric bilateral radial and DP pulses.  No murmurs.  Respiratory:   Normal respiratory effort without tachypnea nor retractions. Breath sounds are clear and equal bilaterally. No wheezes/rales/rhonchi. Gastrointestinal:   Soft and nontender. Non distended. There is no CVA tenderness.  No rebound, rigidity, or guarding. Genitourinary:   deferred Musculoskeletal:   Tenderness in the soft tissues of the right thigh. Compartments are soft. No palpable cords or gross swelling in the area. No phlegmasia. There is a firm knot just below the right medial knee consistent with the identified thrombosed varicose vein from the previous admission. There is palpable cord above and below this as well that is tender. Neurologic:   Normal speech and language.  CN 2-10 normal. Motor grossly intact. No gross focal neurologic deficits are appreciated.  Skin:    Skin is warm, dry and intact. No rash noted.  No petechiae, purpura, or bullae.  ____________________________________________    LABS (pertinent positives/negatives) (all labs ordered are listed, but only abnormal results are displayed) Labs Reviewed  BASIC METABOLIC PANEL - Abnormal; Notable for the following:    Potassium 3.0 (*)    Glucose, Bld 121 (*)    Calcium 8.6 (*)    All other components within normal limits  CBC WITH DIFFERENTIAL/PLATELET - Abnormal; Notable for the following:    WBC 14.2 (*)    Hemoglobin 10.9 (*)    HCT 33.3 (*)    MCV 79.2 (*)    Neutro Abs 9.8 (*)    All other components within normal limits  FIBRIN DERIVATIVES D-DIMER ([facility] ONLY) - Abnormal; Notable for the following:    Fibrin derivatives D-dimer ([facility]) 758 (*)    All other components within normal limits   ____________________________________________   EKG    ____________________________________________     RADIOLOGY  Ultrasound right lower extremity shows same superficial thrombophlebitis near the right knee, no DVT.  ____________________________________________   PROCEDURES   ____________________________________________   INITIAL IMPRESSION / ASSESSMENT AND PLAN / ED COURSE  Pertinent labs & imaging results that were available during my care of the patient were reviewed by me and considered in my medical decision making (see chart for details).  Patient with known superficial thrombophlebitis in the proximal greater saphenous system of the right leg now presents with worsening right leg and thigh pain and shortness of breath. Concern for propagating DVT, possibly with PE. Vital signs are normal. Not in distress. We'll check labs d-dimer ultrasound of the right leg.  ----------------------------------------- 4:01 AM on 12/07/2015 -----------------------------------------  Workup  negative except for d-dimer of 758. Using established trimester specific graduated d-dimer cut off, this is below the accepted threshold. However, with her progressive symptoms including a sending thigh pain and now dyspnea, I recommended that we proceed with a CT angiogram of the chest. However, the patient refuses because she does not want to have anymore needle sticks and she needs a larger bore IV for the test. She instead wishes to be discharged home and will follow up with her obstetrician today for continued monitoring of her symptoms. I gave her strict return precautions regarding any worsening symptoms or chest pain or dizziness, she states she will come back if her symptoms worsen. We will discharge her AGAINST MEDICAL ADVICE     ____________________________________________   FINAL CLINICAL IMPRESSION(S) / ED DIAGNOSES  Final diagnoses:  Right thigh pain  Thrombophlebitis leg superficial, right  Dyspnea       Portions of this note were generated with dragon dictation software. Dictation  errors may occur despite best attempts at proofreading.   Sharman Cheek, MD 12/07/15 [phone]

## 2015-12-07 NOTE — Discharge Instructions (Signed)
Phlebitis Phlebitis is soreness and swelling (inflammation) of a vein. This can occur in your arms, legs, or torso (trunk), as well as deeper inside your body. Phlebitis is usually not serious when it occurs close to the surface of the body. However, it can cause serious problems when it occurs in a vein deeper inside the body. CAUSES  Phlebitis can be triggered by various things, including:   Reduced blood flow through your veins. This can happen with:  Bed rest over a long period.  Long-distance travel.  Injury.  Surgery.  Being overweight (obese) or pregnant.  Having an IV tube put in the vein and getting certain medicines through the vein.  Cancer and cancer treatment.  Use of illegal drugs taken through the vein.  Inflammatory diseases.  Inherited (genetic) diseases that increase the risk of blood clots.  Hormone therapy, such as birth control pills. SIGNS AND SYMPTOMS   Red, tender, swollen, and painful area on your skin. Usually, the area will be long and narrow.  Firmness along the center of the affected area. This can indicate that a blood clot has formed.  Low-grade fever. DIAGNOSIS  A health care provider can usually diagnose phlebitis by examining the affected area and asking about your symptoms. To check for infection or blood clots, your health care provider may order blood tests or an ultrasound exam of the area. Blood tests and your family history may also indicate if you have an underlying genetic disease that causes blood clots. Occasionally, a piece of tissue is taken from the body (biopsy sample) if an unusual cause of phlebitis is suspected. TREATMENT  Treatment will vary depending on the severity of the condition and the area of the body affected. Treatment may include:  Use of a warm compress or heating pad.  Use of compression stockings or bandages.  Anti-inflammatory medicines.  Removal of any IV tube that may be causing the problem.  Medicines  that kill germs (antibiotics) if an infection is present.  Blood-thinning medicines if a blood clot is suspected or present.  In rare cases, surgery may be needed to remove damaged sections of vein. HOME CARE INSTRUCTIONS   Only take over-the-counter or prescription medicines as directed by your health care provider. Take all medicines exactly as prescribed.  Raise (elevate) the affected area above the level of your heart as directed by your health care provider.  Apply a warm compress or heating pad to the affected area as directed by your health care provider. Do not sleep with the heating pad.  Use compression stockings or bandages as directed. These will speed healing and prevent the condition from coming back.  If you are on blood thinners:  Get follow-up blood tests as directed by your health care provider.  Check with your health care provider before using any new medicines.  Carry a medical alert card or wear your medical alert jewelry to show that you are on blood thinners.  For phlebitis in the legs:  Avoid prolonged standing or bed rest.  Keep your legs moving. Raise your legs when sitting or lying.  Do not smoke.  Women, particularly those over the age of 59, should consider the risks and benefits of taking the contraceptive pill. This kind of hormone treatment can increase your risk for blood clots.  Follow up with your health care provider as directed. SEEK MEDICAL CARE IF:   You have unusual bruising or any bleeding problems.  Your swelling or pain in the affected area  is not improving.  You are on anti-inflammatory medicine, and you develop belly (abdominal) pain. SEEK IMMEDIATE MEDICAL CARE IF:   You have a sudden onset of chest pain or difficulty breathing.  You have a fever or persistent symptoms for more than 2-3 days.  You have a fever and your symptoms suddenly get worse. MAKE SURE YOU:  Understand these instructions.  Will watch your  condition.  Will get help right away if you are not doing well or get worse.   This information is not intended to replace advice given to you by your health care provider. Make sure you discuss any questions you have with your health care provider.   Document Released: 07/08/2001 Document Revised: 05/04/2013 Document Reviewed: 03/21/2013 Elsevier Interactive Patient Education 2016 Elsevier Inc.  Musculoskeletal Pain Musculoskeletal pain is muscle and boney aches and pains. These pains can occur in any part of the body. Your caregiver may treat you without knowing the cause of the pain. They may treat you if blood or urine tests, X-rays, and other tests were normal.  CAUSES There is often not a definite cause or reason for these pains. These pains may be caused by a type of germ (virus). The discomfort may also come from overuse. Overuse includes working out too hard when your body is not fit. Boney aches also come from weather changes. Bone is sensitive to atmospheric pressure changes. HOME CARE INSTRUCTIONS   Ask when your test results will be ready. Make sure you get your test results.  Only take over-the-counter or prescription medicines for pain, discomfort, or fever as directed by your caregiver. If you were given medications for your condition, do not drive, operate machinery or power tools, or sign legal documents for 24 hours. Do not drink alcohol. Do not take sleeping pills or other medications that may interfere with treatment.  Continue all activities unless the activities cause more pain. When the pain lessens, slowly resume normal activities. Gradually increase the intensity and duration of the activities or exercise.  During periods of severe pain, bed rest may be helpful. Lay or sit in any position that is comfortable.  Putting ice on the injured area.  Put ice in a bag.  Place a towel between your skin and the bag.  Leave the ice on for 15 to 20 minutes, 3 to 4 times a  day.  Follow up with your caregiver for continued problems and no reason can be found for the pain. If the pain becomes worse or does not go away, it may be necessary to repeat tests or do additional testing. Your caregiver may need to look further for a possible cause. SEEK IMMEDIATE MEDICAL CARE IF:  You have pain that is getting worse and is not relieved by medications.  You develop chest pain that is associated with shortness or breath, sweating, feeling sick to your stomach (nauseous), or throw up (vomit).  Your pain becomes localized to the abdomen.  You develop any new symptoms that seem different or that concern you. MAKE SURE YOU:   Understand these instructions.  Will watch your condition.  Will get help right away if you are not doing well or get worse.   This information is not intended to replace advice given to you by your health care provider. Make sure you discuss any questions you have with your health care provider.   Document Released: 07/14/2005 Document Revised: 10/06/2011 Document Reviewed: 03/18/2013 Elsevier Interactive Patient Education 2016 [employer].  [employer] Therapy  Heat therapy can help ease sore, stiff, injured, and tight muscles and joints. Heat relaxes your muscles, which may help ease your pain. Heat therapy should only be used on old, pre-existing, or long-lasting (chronic) injuries. Do not use heat therapy unless told by your doctor. HOW TO USE HEAT THERAPY There are several different kinds of heat therapy, including:  Moist heat pack.  Warm water bath.  Hot water bottle.  Electric heating pad.  Heated gel pack.  Heated wrap.  Electric heating pad. GENERAL HEAT THERAPY RECOMMENDATIONS   Do not sleep while using heat therapy. Only use heat therapy while you are awake.  Your skin may turn pink while using heat therapy. Do not use heat therapy if your skin turns red.  Do not use heat therapy if you have new pain.  High heat or long  exposure to heat can cause burns. Be careful when using heat therapy to avoid burning your skin.  Do not use heat therapy on areas of your skin that are already irritated, such as with a rash or sunburn. GET HELP IF:   You have blisters, redness, swelling (puffiness), or numbness.  You have new pain.  Your pain is worse. MAKE SURE YOU:  Understand these instructions.  Will watch your condition.  Will get help right away if you are not doing well or get worse.   This information is not intended to replace advice given to you by your health care provider. Make sure you discuss any questions you have with your health care provider.   Document Released: 10/06/2011 Document Revised: 08/04/2014 Document Reviewed: 09/06/2013 Elsevier Interactive Patient Education 2016 [employer]  Shortness of Breath Shortness of breath means you have trouble breathing. It could also mean that you have a medical problem. You should get immediate medical care for shortness of breath. CAUSES   Not enough oxygen in the air such as with high altitudes or a smoke-filled room.  Certain lung diseases, infections, or problems.  Heart disease or conditions, such as angina or heart failure.  Low red blood cells (anemia).  Poor physical fitness, which can cause shortness of breath when you exercise.  Chest or back injuries or stiffness.  Being overweight.  Smoking.  Anxiety, which can make you feel like you are not getting enough air. DIAGNOSIS  Serious medical problems can often be found during your physical exam. Tests may also be done to determine why you are having shortness of breath. Tests may include:  Chest X-rays.  Lung function tests.  Blood tests.  An electrocardiogram (ECG).  An ambulatory electrocardiogram. An ambulatory ECG records your heartbeat patterns over a 24-hour period.  Exercise testing.  A transthoracic echocardiogram (TTE). During echocardiography, sound waves are  used to evaluate how blood flows through your heart.  A transesophageal echocardiogram (TEE).  Imaging scans. Your health care provider may not be able to find a cause for your shortness of breath after your exam. In this case, it is important to have a follow-up exam with your health care provider as directed.  TREATMENT  Treatment for shortness of breath depends on the cause of your symptoms and can vary greatly. HOME CARE INSTRUCTIONS   Do not smoke. Smoking is a common cause of shortness of breath. If you smoke, ask for help to quit.  Avoid being around chemicals or things that may bother your breathing, such as paint fumes and dust.  Rest as needed. Slowly resume your usual activities.  If medicines were prescribed,  take them as directed for the full length of time directed. This includes oxygen and any inhaled medicines.  Keep all follow-up appointments as directed by your health care provider. SEEK MEDICAL CARE IF:   Your condition does not improve in the time expected.  You have a hard time doing your normal activities even with rest.  You have any new symptoms. SEEK IMMEDIATE MEDICAL CARE IF:   Your shortness of breath gets worse.  You feel light-headed, faint, or develop a cough not controlled with medicines.  You start coughing up blood.  You have pain with breathing.  You have chest pain or pain in your arms, shoulders, or abdomen.  You have a fever.  You are unable to walk up stairs or exercise the way you normally do. MAKE SURE YOU:  Understand these instructions.  Will watch your condition.  Will get help right away if you are not doing well or get worse.   This information is not intended to replace advice given to you by your health care provider. Make sure you discuss any questions you have with your health care provider.   Document Released: 04/08/2001 Document Revised: 07/19/2013 Document Reviewed: 09/29/2011 Elsevier Interactive Patient  Education [employer]

## 2016-01-01 ENCOUNTER — Encounter (HOSPITAL_COMMUNITY): Payer: Self-pay

## 2016-01-01 ENCOUNTER — Inpatient Hospital Stay (HOSPITAL_COMMUNITY)
Admission: AD | Admit: 2016-01-01 | Discharge: 2016-01-07 | DRG: 765 | Disposition: A | Discharge status: Home / Self Care | Payer: Medicaid Other | Source: Ambulatory Visit | Attending: Obstetrics and Gynecology | Admitting: Obstetrics and Gynecology

## 2016-01-01 DIAGNOSIS — G2581 Restless legs syndrome: Secondary | ICD-10-CM | POA: Diagnosis present

## 2016-01-01 DIAGNOSIS — O34211 Maternal care for low transverse scar from previous cesarean delivery: Secondary | ICD-10-CM | POA: Diagnosis present

## 2016-01-01 DIAGNOSIS — Z98891 History of uterine scar from previous surgery: Secondary | ICD-10-CM

## 2016-01-01 DIAGNOSIS — O99284 Endocrine, nutritional and metabolic diseases complicating childbirth: Secondary | ICD-10-CM | POA: Diagnosis present

## 2016-01-01 DIAGNOSIS — Z3490 Encounter for supervision of normal pregnancy, unspecified, unspecified trimester: Secondary | ICD-10-CM

## 2016-01-01 DIAGNOSIS — O99214 Obesity complicating childbirth: Secondary | ICD-10-CM | POA: Diagnosis present

## 2016-01-01 DIAGNOSIS — O4400 Placenta previa specified as without hemorrhage, unspecified trimester: Secondary | ICD-10-CM

## 2016-01-01 DIAGNOSIS — E039 Hypothyroidism, unspecified: Secondary | ICD-10-CM | POA: Diagnosis present

## 2016-01-01 DIAGNOSIS — Z8672 Personal history of thrombophlebitis: Secondary | ICD-10-CM

## 2016-01-01 DIAGNOSIS — I10 Essential (primary) hypertension: Secondary | ICD-10-CM

## 2016-01-01 DIAGNOSIS — R0602 Shortness of breath: Secondary | ICD-10-CM

## 2016-01-01 DIAGNOSIS — O9902 Anemia complicating childbirth: Secondary | ICD-10-CM | POA: Diagnosis present

## 2016-01-01 DIAGNOSIS — O1414 Severe pre-eclampsia complicating childbirth: Principal | ICD-10-CM | POA: Diagnosis present

## 2016-01-01 DIAGNOSIS — E0590 Thyrotoxicosis, unspecified without thyrotoxic crisis or storm: Secondary | ICD-10-CM

## 2016-01-01 DIAGNOSIS — O99891 Other specified diseases and conditions complicating pregnancy: Secondary | ICD-10-CM

## 2016-01-01 DIAGNOSIS — Z6841 Body Mass Index (BMI) 40.0 and over, adult: Secondary | ICD-10-CM

## 2016-01-01 DIAGNOSIS — Z8249 Family history of ischemic heart disease and other diseases of the circulatory system: Secondary | ICD-10-CM

## 2016-01-01 DIAGNOSIS — O24429 Gestational diabetes mellitus in childbirth, unspecified control: Secondary | ICD-10-CM | POA: Diagnosis present

## 2016-01-01 DIAGNOSIS — O133 Gestational [pregnancy-induced] hypertension without significant proteinuria, third trimester: Secondary | ICD-10-CM | POA: Diagnosis present

## 2016-01-01 DIAGNOSIS — J811 Chronic pulmonary edema: Secondary | ICD-10-CM

## 2016-01-01 DIAGNOSIS — O139 Gestational [pregnancy-induced] hypertension without significant proteinuria, unspecified trimester: Secondary | ICD-10-CM

## 2016-01-01 DIAGNOSIS — Z3A34 34 weeks gestation of pregnancy: Secondary | ICD-10-CM

## 2016-01-01 DIAGNOSIS — O1410 Severe pre-eclampsia, unspecified trimester: Secondary | ICD-10-CM

## 2016-01-01 DIAGNOSIS — O9989 Other specified diseases and conditions complicating pregnancy, childbirth and the puerperium: Secondary | ICD-10-CM

## 2016-01-01 DIAGNOSIS — O09299 Supervision of pregnancy with other poor reproductive or obstetric history, unspecified trimester: Secondary | ICD-10-CM

## 2016-01-01 LAB — URINALYSIS, ROUTINE W REFLEX MICROSCOPIC
BILIRUBIN URINE: NEGATIVE
GLUCOSE, UA: NEGATIVE mg/dL
HGB URINE DIPSTICK: NEGATIVE
Ketones, ur: NEGATIVE mg/dL
Leukocytes, UA: NEGATIVE
Nitrite: NEGATIVE
PROTEIN: 30 mg/dL — AB
Specific Gravity, Urine: >1.03 — ABNORMAL HIGH (ref 1.005–1.030)
pH: 6 (ref 5.0–8.0)

## 2016-01-01 LAB — COMPREHENSIVE METABOLIC PANEL
ALBUMIN: 2.6 g/dL — AB (ref 3.5–5.0)
ALT: 12 U/L — ABNORMAL LOW (ref 14–54)
ANION GAP: 10 (ref 5–15)
AST: 16 U/L (ref 15–41)
Alkaline Phosphatase: 66 U/L (ref 38–126)
BUN: 9 mg/dL (ref 6–20)
CO2: 19 mmol/L — AB (ref 22–32)
Calcium: 8.5 mg/dL — ABNORMAL LOW (ref 8.9–10.3)
Chloride: 108 mmol/L (ref 101–111)
Creatinine, Ser: 0.55 mg/dL (ref 0.44–1.00)
GFR calc Af Amer: >60 mL/min (ref >60)
GFR calc non Af Amer: >60 mL/min (ref >60)
GLUCOSE: 117 mg/dL — AB (ref 65–99)
POTASSIUM: 3.2 mmol/L — AB (ref 3.5–5.1)
SODIUM: 137 mmol/L (ref 135–145)
Total Bilirubin: 0.6 mg/dL (ref 0.3–1.2)
Total Protein: 6.4 g/dL — ABNORMAL LOW (ref 6.5–8.1)

## 2016-01-01 LAB — CBC
HCT: 31.5 % — ABNORMAL LOW (ref 36.0–46.0)
HEMOGLOBIN: 10.1 g/dL — AB (ref 12.0–15.0)
MCH: 24.7 pg — ABNORMAL LOW (ref 26.0–34.0)
MCHC: 32.1 g/dL (ref 30.0–36.0)
MCV: 77 fL — ABNORMAL LOW (ref 78.0–100.0)
PLATELETS: 254 10*3/uL (ref 150–400)
RBC: 4.09 MIL/uL (ref 3.87–5.11)
RDW: 14 % (ref 11.5–15.5)
WBC: 12.5 10*3/uL — ABNORMAL HIGH (ref 4.0–10.5)

## 2016-01-01 LAB — PROTEIN / CREATININE RATIO, URINE
Creatinine, Urine: 236 mg/dL
PROTEIN CREATININE RATIO: 0.28 mg/mg{creat} — AB (ref 0.00–0.15)
TOTAL PROTEIN, URINE: 67 mg/dL

## 2016-01-01 LAB — URINE MICROSCOPIC-ADD ON

## 2016-01-01 LAB — LACTATE DEHYDROGENASE: LDH: 125 U/L (ref 98–192)

## 2016-01-01 LAB — URIC ACID: Uric Acid, Serum: 5.7 mg/dL (ref 2.3–6.6)

## 2016-01-01 MED ORDER — SODIUM CHLORIDE 0.9% FLUSH
3.0000 mL | INTRAVENOUS | Status: DC | PRN
  Administered 2016-01-02: 3 mL via INTRAVENOUS
  Filled 2016-01-01: qty 3

## 2016-01-01 MED ORDER — SODIUM CHLORIDE 0.9% FLUSH
3.0000 mL | Freq: Two times a day (BID) | INTRAVENOUS | Status: DC
  Administered 2016-01-02 – 2016-01-04 (×2): 3 mL via INTRAVENOUS

## 2016-01-01 MED ORDER — SODIUM CHLORIDE 0.9 % IV SOLN: 250.0000 mL | INTRAVENOUS | Status: DC | PRN

## 2016-01-01 MED ORDER — PRENATAL MULTIVITAMIN CH
1.0000 | ORAL_TABLET | Freq: Every day | ORAL | Status: DC
  Administered 2016-01-02 – 2016-01-03 (×2): 1 via ORAL
  Filled 2016-01-01 (×3): qty 1

## 2016-01-01 MED ORDER — BUTALBITAL-APAP-CAFFEINE 50-325-40 MG PO TABS
1.0000 | ORAL_TABLET | Freq: Once | ORAL | Status: AC
  Administered 2016-01-01: 1 via ORAL
  Filled 2016-01-01: qty 1

## 2016-01-01 MED ORDER — BETAMETHASONE SOD PHOS & ACET 6 (3-3) MG/ML IJ SUSP
12.0000 mg | INTRAMUSCULAR | Status: AC
  Administered 2016-01-02 – 2016-01-03 (×2): 12 mg via INTRAMUSCULAR
  Filled 2016-01-01 (×2): qty 2

## 2016-01-01 MED ORDER — ACETAMINOPHEN 325 MG PO TABS: 650.0000 mg | ORAL_TABLET | ORAL | Status: DC | PRN

## 2016-01-01 MED ORDER — HYDRALAZINE HCL 20 MG/ML IJ SOLN: 10.0000 mg | Freq: Once | INTRAMUSCULAR | Status: DC | PRN

## 2016-01-01 MED ORDER — DOCUSATE SODIUM 100 MG PO CAPS
100.0000 mg | ORAL_CAPSULE | Freq: Every day | ORAL | Status: DC
  Filled 2016-01-01 (×3): qty 1

## 2016-01-01 MED ORDER — ZOLPIDEM TARTRATE 5 MG PO TABS
5.0000 mg | ORAL_TABLET | Freq: Every evening | ORAL | Status: DC | PRN
  Administered 2016-01-04: 5 mg via ORAL
  Filled 2016-01-01: qty 1

## 2016-01-01 MED ORDER — LABETALOL HCL 5 MG/ML IV SOLN: 20.0000 mg | INTRAVENOUS | Status: DC | PRN

## 2016-01-01 MED ORDER — CALCIUM CARBONATE ANTACID 500 MG PO CHEW
2.0000 | CHEWABLE_TABLET | ORAL | Status: DC | PRN
Start: 2016-01-01 — End: 2016-01-04
  Administered 2016-01-02: 400 mg via ORAL
  Filled 2016-01-01 (×2): qty 2

## 2016-01-01 NOTE — H&P (Signed)
Stephanie [name] is a 30 y.o. female, G3P1102  at 31.1 weeks, presenting for elevated BP, elevated BP, SOB, visual disturbances, headache unrelieved by Tylenol, and generalized edema. Reports +FM, and denies RUW pain, contractions, vaginal discharge, vb, or lof. States her history is significant for preeclampsia with pulmonary edema in a previous pregnancy, Hx of 2 previous cesarean sections, and placenta previa in this pregnancy. Repeat LTCs scheduled on 02/07/16. Next appointment at [place] is on 01/10/16 For US/ROB.  .  Patient Active Problem List   Diagnosis Date Noted  . Gestational hypertension w/o significant proteinuria in 3rd trimester 01/01/2016  . Thrombophlebitis leg superficial 11/29/2015  . Placenta previa 11/29/2015  . H/O: C-section 11/29/2015  . H/O migraine (no meds) 11/29/2015  . Obesity (BMI 30-39.9) 11/29/2015  . Rubella non-immune status, antepartum 11/29/2015  . Hypokalemia 11/29/2015  . H/O gastroesophageal reflux (GERD) 11/29/2015  . Restless leg syndrome 11/29/2015    History of present pregnancy: Patient entered care at 14.3 weeks.   EDC of 03/04/16 was established by 14 wks [place] on  09/04/15.    Anatomy scan:  18.2  Weeks on 10/04/15, with normal findings and a posterior placenta previa.    SIUP at 18.2 wks, elevated HCG, low risk panorama, hx prior C/s, hx preeclampsia; EFW 32%, No dysmorthic  features, posterior placenta previa, placenta with internal sonolucencies/lacunae.     Additional [place] evaluations:   14wks Dating on 09/04/15: IUP seen, 14 wks, normal FHTs;  Right ovary and adnexas unremarkable, no free fluid in  CDs   19.6 wks on 10/15/15: F/U anatomy. 152 BPM. Singleton preg. Vertex. Cervix meas. TV=5.0 cm. Posterior placenta,  which covers internal OS. AFI WNL. Anatomy complete 24.1 wks for Growth, previa on 11/14/15 : Stephanie [name] pregnancy. TRV HML presentation. Cervix measured TV 5.0 cm.  Posterior placenta. Placenta edge covers posterior wall of internal os  - previa. Amniotic fluid appears normal.  EFW 1 lb 8 oz (649g) 57th%. Ovaries/adnexas unremarkable. 28.1 wks for Previa 12/12/15 : 155 bpm. Breech pres. Posterior complete previa. AFI normal- 80th %tile, cx closed.   Significant prenatal events:  First Trimester:  No PNC Second Trimester:   Report Pre-clampsia with pulmonary edema and urgent C/s at 28 wks in first pregnancy, second LTCS at term.    Elevated risk of DS noted on second trimester screen, F/u panorama reveal low risk,  MFM consult and genetic referal provided. C/o common discomforts of pregnancy - sciatica, abdominal pain, nausea. Placenta previa noted on anatomy US.  Third Trimester:   C/o swelling in hand, feet, restless leg syndrome and bulging varicose veins - Dx thromboplebitis .  Received various doppler studies to R/o embolus. 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 D DIMER SLIGHTLY ELEVATED AT 758. PER MD NOTE: "WITH HER PROGRESSIVE SX OF INCLUDING (ASCENDING) THIGH PAIN AND NOW DYSPNEA, I RECOMMENDED WE PROCEED WITH CT ANGIOGRAM OF THE CHEST. ...Marland KitchenPATIENT REFUSES BECAUSE SHE DOES NOT WANT ANY MORE NEEDLE STICKS AND SHE NEEDS A LARGER BORE IV FOR THE TEST. SHE INSTEAD WISHES TO BE DISCHARGED HOME AND WILL F/U WITH HER OBSTETRICIAN TODAY FOR CONTINUED MONITORING OF HER SX." GIVEN STRICT PRECAUTIONS REGARDING ANY WORSENING SX, STATES SHE WILL COME BACK IF SX WORSEN.     - Has not scheduled 3 hr GTT.    Last evaluation:   30.1 wks on 12/26/15 with Stephanie Manns, MD.   FH 31 cm, 150 bpm,   BP  124/74, 252 lbs .  OB routine Declined Flu and T-dap vaccines 12/12/2015 RPR and HIV NR, Normal Hgb No recent hospital visit. pg,cma. U/S at [place] to f/u previa and check EFW. Doing well. Recheck TSH and Free T4. U/S already scheduled on 6/15. Pt did not go for 3hr GTT. Will r/s. Repeat C/S scheduled for 7/13. She needs BMZ 7/11 and 7/12.  OB History    Gravida Para Term Preterm AB  TAB SAB Ectopic Multiple Living   '3 2 1 1      2     ' 07/06/2008, 28 wks, F,  3lbs 4oz, cesarean section 06/09/2009, 38wks, 14 lbs, 2oz ; Cesarean section   Past Medical History  Diagnosis Date  . Ectopic pregnancy, tubal     left   . Hypertension   . Preeclampsia   . Hypothyroidism   . Pulmonary edema     third trimester   Past Surgical History  Procedure Laterality Date  . Cesarean section     Family History: family history includes Hypertension in her father. Social History:  reports that she has never smoked. She does not have any smokeless tobacco history on file. She reports that she does not drink alcohol or use illicit drugs.     Prenatal Transfer Tool  Maternal Diabetes:   3 HR GTT ordered, abnormal 1 hr GTT  Genetic Screening: Abnormal:  Results: Other:   initially abnormal AFP, recalculated and found to be normal, Panorama low risk Maternal Ultrasounds/Referrals: Normal Fetal Ultrasounds or other Referrals:  Referred to Materal Fetal Medicine   Maternal Substance Abuse:  No Significant Maternal Medications:  Meds include: Other:   PNV, Flexeril, tylenol Significant Maternal Lab Results: Lab values include: Other:   GBS pending  TDAP declined Flu declined  ROS:  All systems review and negative expect as noted in HPI  No Known Allergies     Blood pressure 146/98, pulse 75, temperature 98.1 F (36.7 C), temperature source Oral, resp. rate 20, height '5\' 6"'  (1.676 m), weight 117.028 kg (258 lb), last menstrual period 05/21/2015, SpO2 100 %.   Filed Vitals:   01/01/16 2158 01/01/16 2232 01/01/16 2309 01/02/16 0052  BP:  129/77 146/98 140/73  Pulse: 77 70 75 74  Temp:   98.1 F (36.7 C) 98.1 F (36.7 C)  TempSrc:   Oral Oral  Resp:   20 18  Height:      Weight:      SpO2: 100%       Chest clear Heart RRR without murmur Abd gravid, NT, FH 32 Pelvic: deferred Ext:  Pitting edema, +1   FHR: reassuring for gestational age, 150 bpm moderate  variability, +accels, no decels UCs:  none  Results for orders placed or performed during the hospital encounter of 01/01/16 (from the past 48 hour(s))  Urinalysis, Routine w reflex microscopic (not at [facility])     Status: Abnormal   Collection Time: 01/01/16  8:18 PM  Result Value Ref Range   Color, Urine YELLOW YELLOW   APPearance CLEAR CLEAR   Specific Gravity, Urine >1.030 (H) 1.005 - 1.030   pH 6.0 5.0 - 8.0   Glucose, UA NEGATIVE NEGATIVE mg/dL   Hgb urine dipstick NEGATIVE NEGATIVE   Bilirubin Urine NEGATIVE NEGATIVE   Ketones, ur NEGATIVE NEGATIVE mg/dL   Protein, ur 30 (A) NEGATIVE mg/dL   Nitrite NEGATIVE NEGATIVE   Leukocytes, UA NEGATIVE NEGATIVE  Urine microscopic-add on     Status: Abnormal   Collection Time: 01/01/16  8:18 PM  Result Value Ref Range   Squamous Epithelial / LPF 0-5 (A) NONE SEEN   WBC, UA 0-5 0 - 5 WBC/hpf   RBC / HPF 0-5 0 - 5 RBC/hpf   Bacteria, UA FEW (A) NONE SEEN  Protein / creatinine ratio, urine     Status: Abnormal   Collection Time: 01/01/16  8:18 PM  Result Value Ref Range   Creatinine, Urine 236.00 mg/dL   Total Protein, Urine 67 mg/dL    Comment: NO NORMAL RANGE ESTABLISHED FOR THIS TEST   Protein Creatinine Ratio 0.28 (H) 0.00 - 0.15 mg/mg[Cre]  CBC     Status: Abnormal   Collection Time: 01/01/16  9:25 PM  Result Value Ref Range   WBC 12.5 (H) 4.0 - 10.5 K/uL   RBC 4.09 3.87 - 5.11 MIL/uL   Hemoglobin 10.1 (L) 12.0 - 15.0 g/dL   HCT 31.5 (L) 36.0 - 46.0 %   MCV 77.0 (L) 78.0 - 100.0 fL   MCH 24.7 (L) 26.0 - 34.0 pg   MCHC 32.1 30.0 - 36.0 g/dL   RDW 14.0 11.5 - 15.5 %   Platelets 254 150 - 400 K/uL  Comprehensive metabolic panel     Status: Abnormal   Collection Time: 01/01/16  9:25 PM  Result Value Ref Range   Sodium 137 135 - 145 mmol/L   Potassium 3.2 (L) 3.5 - 5.1 mmol/L   Chloride 108 101 - 111 mmol/L   CO2 19 (L) 22 - 32 mmol/L   Glucose, Bld 117 (H) 65 - 99 mg/dL   BUN 9 6 - 20 mg/dL   Creatinine, Ser 0.55 0.44 -  1.00 mg/dL   Calcium 8.5 (L) 8.9 - 10.3 mg/dL   Total Protein 6.4 (L) 6.5 - 8.1 g/dL   Albumin 2.6 (L) 3.5 - 5.0 g/dL   AST 16 15 - 41 U/L   ALT 12 (L) 14 - 54 U/L   Alkaline Phosphatase 66 38 - 126 U/L   Total Bilirubin 0.6 0.3 - 1.2 mg/dL   GFR calc non Af Amer >60 >60 mL/min   GFR calc Af Amer >60 >60 mL/min    Comment: (NOTE) The eGFR has been calculated using the CKD EPI equation. This calculation has not been validated in all clinical situations. eGFR's persistently <60 mL/min signify possible Chronic Kidney Disease.    Anion gap 10 5 - 15  Lactate dehydrogenase     Status: None   Collection Time: 01/01/16  9:25 PM  Result Value Ref Range   LDH 125 98 - 192 U/L  Uric acid     Status: None   Collection Time: 01/01/16  9:25 PM  Result Value Ref Range   Uric Acid, Serum 5.7 2.3 - 6.6 mg/dL  Type and screen [place]     Status: None   Collection Time: 01/01/16 11:58 PM  Result Value Ref Range   ABO/RH(D) O POS    Antibody Screen NEG    Sample Expiration 01/04/2016      Prenatal labs: ABO, Rh: --/--/O POS, O POS (05/03 2356) Antibody: NEG (05/03 2356) Rubella:  !Error!  Not immune RPR:    NR 09/20/15, 12/12/15 HBsAg:    Negative 09/20/15 HIV:     Negative 09/20/15 GBS:     unknown Sickle cell/Hgb electrophoresis:  N/A Pap:  Normal 09/20/15 GC:  Negative  08/30/15 Chlamydia:  Negative  08/30/15 Genetic screenings:AFP positive - recalculated and found to be normal, panorama  Low  risk 09/27/15 Glucola:   1hr  Abnormal, 3 HR GTT - NOT COMPLETED Other:   Thyroid panel abnormal in Feb; repeated T23fee, TSH  Normal  On 12/26/15 Hgb 13.7 at NOB, 12.1 at 28 weeks   Assessment/Plan: IUP at 31.1 Gestational hypertension thrombophlebitis Hx Cesarean Section x2 Hx Pre-Eclampsia HX GDM Hx Thyroid dysfunction Proteinuria PIH labs wnl, PCR 0.28  Plan: Admit to antenatal unit for 24hrs Observation per consult with Dr. RMancel BaleRoutine [facility] orders Fioricet  for HA BMZ , first dose 6/6 and 2nd dose 6/7 24 hr urine Repeat PIH labs in AM 3 hr GTT in AM  Repeat [place]- EFW, AFI, Placenta placement  RCherre Huger [place] 01/01/2016, 11:35 PM

## 2016-01-01 NOTE — MAU Note (Signed)
Pt reports for the past 2 days she has had elevated b/p at home 140's/90's and a constant headache. She also has been short of breath and has increased swelling.

## 2016-01-01 NOTE — MAU Provider Note (Signed)
Stephanie [name] is 30yo, [id number], at 31.0 wks presenting to MAU unannounced for elevated BP at home for 140s-90s, visual disturbances and a constant headache un-relieved by tylenol.   She also reports SOB, generalized edema with a marked increased of lower extremely/ pedal swelling and a  weight gain of 12 lbs in the past week.  Reports +FM, and denies RUW pain, contractions, vaginal discharge, vb, or lof.   States her history is significant for preeclampsia with pulmonary edema in a previous pregnancy, Hx of 2 previous cesarean sections, and placenta previa in this pregnancy. Repeat LTCs scheduled for placenta previa in this pregnancy.   Next appointment at [facility] is on  01/10/16  For US/ROB.   History     Patient Active Problem List   Diagnosis Date Noted  . Hyperthyroidism 01/02/2016  . Gestational hypertension w/o significant proteinuria in 3rd trimester 01/01/2016  . Thrombophlebitis leg superficial 11/29/2015  . Placenta previa 11/29/2015  . H/O: C-section 11/29/2015  . H/O migraine (no meds) 11/29/2015  . Obesity (BMI 30-39.9) 11/29/2015  . Rubella non-immune status, antepartum 11/29/2015  . Hypokalemia 11/29/2015  . H/O gastroesophageal reflux (GERD) 11/29/2015  . Restless leg syndrome 11/29/2015    No chief complaint on file.  HPI  OB History    Gravida Para Term Preterm AB TAB SAB Ectopic Multiple Living   '3 2 1 1      2      ' Past Medical History  Diagnosis Date  . Ectopic pregnancy, tubal     left   . Hypertension   . Preeclampsia   . Hypothyroidism   . Pulmonary edema     third trimester    Past Surgical History  Procedure Laterality Date  . Cesarean section      Family History  Problem Relation Age of Onset  . Hypertension Father     Social History  Substance Use Topics  . Smoking status: Never Smoker   . Smokeless tobacco: None  . Alcohol Use: No    Allergies: No Known Allergies  Prescriptions prior to admission  Medication Sig Dispense Refill  Last Dose  . acetaminophen (TYLENOL) 325 MG tablet Take 650 mg by mouth every 6 (six) hours as needed for mild pain or headache.   01/01/2016 at Unknown time  . Prenatal Vit-Fe Fumarate-FA (PRENATAL MULTIVITAMIN) TABS tablet Take 1 tablet by mouth daily at 12 noon.   Past Week at Unknown time    ROS Physical Exam   Blood pressure 135/82, pulse 77, temperature 98.4 F (36.9 C), temperature source Oral, resp. rate 20, height '5\' 6"'  (1.676 m), weight 117.028 kg (258 lb), last menstrual period 05/21/2015, SpO2 100 %.  Filed Vitals:   01/01/16 2153 01/01/16 2158 01/01/16 2232 01/01/16 2309  BP:   129/77 146/98  Pulse: 80 77 70 75  Temp:    98.1 F (36.7 C)  TempSrc:    Oral  Resp:    20  Height:      Weight:      SpO2: 98% 100%      Results for orders placed or performed during the hospital encounter of 01/01/16 (from the past 72 hour(s))  Urinalysis, Routine w reflex microscopic (not at [facility])     Status: Abnormal   Collection Time: 01/01/16  8:18 PM  Result Value Ref Range   Color, Urine YELLOW YELLOW   APPearance CLEAR CLEAR   Specific Gravity, Urine >1.030 (H) 1.005 - 1.030   pH 6.0 5.0 - 8.0  Glucose, UA NEGATIVE NEGATIVE mg/dL   Hgb urine dipstick NEGATIVE NEGATIVE   Bilirubin Urine NEGATIVE NEGATIVE   Ketones, ur NEGATIVE NEGATIVE mg/dL   Protein, ur 30 (A) NEGATIVE mg/dL   Nitrite NEGATIVE NEGATIVE   Leukocytes, UA NEGATIVE NEGATIVE  Urine microscopic-add on     Status: Abnormal   Collection Time: 01/01/16  8:18 PM  Result Value Ref Range   Squamous Epithelial / LPF 0-5 (A) NONE SEEN   WBC, UA 0-5 0 - 5 WBC/hpf   RBC / HPF 0-5 0 - 5 RBC/hpf   Bacteria, UA FEW (A) NONE SEEN  Protein / creatinine ratio, urine     Status: Abnormal   Collection Time: 01/01/16  8:18 PM  Result Value Ref Range   Creatinine, Urine 236.00 mg/dL   Total Protein, Urine 67 mg/dL    Comment: NO NORMAL RANGE ESTABLISHED FOR THIS TEST   Protein Creatinine Ratio 0.28 (H) 0.00 - 0.15 mg/mg[Cre]   CBC     Status: Abnormal   Collection Time: 01/01/16  9:25 PM  Result Value Ref Range   WBC 12.5 (H) 4.0 - 10.5 K/uL   RBC 4.09 3.87 - 5.11 MIL/uL   Hemoglobin 10.1 (L) 12.0 - 15.0 g/dL   HCT 31.5 (L) 36.0 - 46.0 %   MCV 77.0 (L) 78.0 - 100.0 fL   MCH 24.7 (L) 26.0 - 34.0 pg   MCHC 32.1 30.0 - 36.0 g/dL   RDW 14.0 11.5 - 15.5 %   Platelets 254 150 - 400 K/uL  Comprehensive metabolic panel     Status: Abnormal   Collection Time: 01/01/16  9:25 PM  Result Value Ref Range   Sodium 137 135 - 145 mmol/L   Potassium 3.2 (L) 3.5 - 5.1 mmol/L   Chloride 108 101 - 111 mmol/L   CO2 19 (L) 22 - 32 mmol/L   Glucose, Bld 117 (H) 65 - 99 mg/dL   BUN 9 6 - 20 mg/dL   Creatinine, Ser 0.55 0.44 - 1.00 mg/dL   Calcium 8.5 (L) 8.9 - 10.3 mg/dL   Total Protein 6.4 (L) 6.5 - 8.1 g/dL   Albumin 2.6 (L) 3.5 - 5.0 g/dL   AST 16 15 - 41 U/L   ALT 12 (L) 14 - 54 U/L   Alkaline Phosphatase 66 38 - 126 U/L   Total Bilirubin 0.6 0.3 - 1.2 mg/dL   GFR calc non Af Amer >60 >60 mL/min   GFR calc Af Amer >60 >60 mL/min    Comment: (NOTE) The eGFR has been calculated using the CKD EPI equation. This calculation has not been validated in all clinical situations. eGFR's persistently <60 mL/min signify possible Chronic Kidney Disease.    Anion gap 10 5 - 15  Lactate dehydrogenase     Status: None   Collection Time: 01/01/16  9:25 PM  Result Value Ref Range   LDH 125 98 - 192 U/L  Uric acid     Status: None   Collection Time: 01/01/16  9:25 PM  Result Value Ref Range   Uric Acid, Serum 5.7 2.3 - 6.6 mg/dL  Type and screen [place]     Status: None   Collection Time: 01/01/16 11:58 PM  Result Value Ref Range   ABO/RH(D) O POS    Antibody Screen NEG    Sample Expiration 01/04/2016    FHT:  Reassuring for gestational age, 150 bpm moderate variability, +accels, no decels UC: None    Physical Exam  Constitutional: She is oriented to person, place, and time. She appears  well-developed.  HENT:  Head: Normocephalic.  Eyes: Pupils are equal, round, and reactive to light.  Neck: Normal range of motion.  Cardiovascular: Normal rate, regular rhythm and normal heart sounds.   Respiratory: Effort normal and breath sounds normal. No respiratory distress. She has no wheezes. She has no rales. She exhibits no tenderness.  GI: Soft.  Musculoskeletal: Normal range of motion.  Neurological: She is alert and oriented to person, place, and time. She has normal reflexes.  Skin: Skin is warm and dry.  Psychiatric: She has a normal mood and affect. Her behavior is normal.    ED Course  Assessment: IUP 31.1 wks Generalized Edema Elevated BP - 140-150/80-90's Headache with visual disturbances FHT reassuring for gestational age PIH labs WNL, PCR .28   Plan: Consult with Dr. Mancel Bale  Fioricet for HA Admit for 24 hr observation BMZ  24 hr urine Repeat PIH labs in AM 3 hr GTT in AM  Repeat US  - EFW, AFI, Placenta placement   Lavetta Nielsen CNM, MSN 01/01/2016 9:19 PM

## 2016-01-02 ENCOUNTER — Encounter (HOSPITAL_COMMUNITY)
Admit: 2016-01-02 | Discharge: 2016-01-02 | Disposition: A | Discharge status: Home / Self Care | Payer: Medicaid Other | Attending: Obstetrics and Gynecology | Admitting: Obstetrics and Gynecology

## 2016-01-02 ENCOUNTER — Encounter (HOSPITAL_COMMUNITY): Payer: Self-pay | Admitting: *Deleted

## 2016-01-02 ENCOUNTER — Observation Stay (HOSPITAL_COMMUNITY): Payer: Medicaid Other

## 2016-01-02 DIAGNOSIS — O09299 Supervision of pregnancy with other poor reproductive or obstetric history, unspecified trimester: Secondary | ICD-10-CM

## 2016-01-02 DIAGNOSIS — O1410 Severe pre-eclampsia, unspecified trimester: Secondary | ICD-10-CM

## 2016-01-02 DIAGNOSIS — E0590 Thyrotoxicosis, unspecified without thyrotoxic crisis or storm: Secondary | ICD-10-CM

## 2016-01-02 LAB — CBC
HEMATOCRIT: 33.6 % — AB (ref 36.0–46.0)
Hemoglobin: 10.9 g/dL — ABNORMAL LOW (ref 12.0–15.0)
MCH: 24.9 pg — AB (ref 26.0–34.0)
MCHC: 32.4 g/dL (ref 30.0–36.0)
MCV: 76.7 fL — AB (ref 78.0–100.0)
PLATELETS: 269 10*3/uL (ref 150–400)
RBC: 4.38 MIL/uL (ref 3.87–5.11)
RDW: 13.9 % (ref 11.5–15.5)
WBC: 14 10*3/uL — ABNORMAL HIGH (ref 4.0–10.5)

## 2016-01-02 LAB — COMPREHENSIVE METABOLIC PANEL
ALT: 10 U/L — ABNORMAL LOW (ref 14–54)
AST: 18 U/L (ref 15–41)
Albumin: 2.7 g/dL — ABNORMAL LOW (ref 3.5–5.0)
Alkaline Phosphatase: 71 U/L (ref 38–126)
Anion gap: 8 (ref 5–15)
BUN: 9 mg/dL (ref 6–20)
CHLORIDE: 109 mmol/L (ref 101–111)
CO2: 20 mmol/L — ABNORMAL LOW (ref 22–32)
CREATININE: 0.47 mg/dL (ref 0.44–1.00)
Calcium: 8.6 mg/dL — ABNORMAL LOW (ref 8.9–10.3)
Glucose, Bld: 127 mg/dL — ABNORMAL HIGH (ref 65–99)
POTASSIUM: 3.8 mmol/L (ref 3.5–5.1)
Sodium: 137 mmol/L (ref 135–145)
TOTAL PROTEIN: 6.8 g/dL (ref 6.5–8.1)
Total Bilirubin: 0.1 mg/dL — ABNORMAL LOW (ref 0.3–1.2)

## 2016-01-02 LAB — GLUCOSE, 3 HOUR GESTATIONAL: Glucose, GTT - 3 Hour: 172 mg/dL — ABNORMAL HIGH (ref 70–144)

## 2016-01-02 LAB — GLUCOSE, 1 HOUR GESTATIONAL: GLUCOSE, 1 HOUR-GESTATIONAL: 196 mg/dL — AB (ref 70–189)

## 2016-01-02 LAB — TYPE AND SCREEN
ABO/RH(D): O POS
Antibody Screen: NEGATIVE

## 2016-01-02 LAB — GLUCOSE, FASTING GESTATIONAL: Glucose Tolerance, Fasting: 128 mg/dL

## 2016-01-02 LAB — LACTATE DEHYDROGENASE: LDH: 126 U/L (ref 98–192)

## 2016-01-02 LAB — GLUCOSE, CAPILLARY
Glucose-Capillary: 107 mg/dL — ABNORMAL HIGH (ref 65–99)
Glucose-Capillary: 123 mg/dL — ABNORMAL HIGH (ref 65–99)

## 2016-01-02 LAB — URIC ACID: URIC ACID, SERUM: 5.4 mg/dL (ref 2.3–6.6)

## 2016-01-02 LAB — GLUCOSE, 2 HOUR GESTATIONAL: GLUCOSE, 2 HOUR-GESTATIONAL: 226 mg/dL — AB (ref 70–164)

## 2016-01-02 LAB — MAGNESIUM: Magnesium: 3.9 mg/dL — ABNORMAL HIGH (ref 1.7–2.4)

## 2016-01-02 MED ORDER — MAGNESIUM SULFATE BOLUS VIA INFUSION
4.0000 g | Freq: Once | INTRAVENOUS | Status: AC
  Administered 2016-01-02: 4 g via INTRAVENOUS
  Filled 2016-01-02: qty 500

## 2016-01-02 MED ORDER — INSULIN ASPART 100 UNIT/ML ~~LOC~~ SOLN: 4.0000 [IU] | Freq: Four times a day (QID) | SUBCUTANEOUS | Status: DC | PRN

## 2016-01-02 MED ORDER — ASPIRIN 81 MG PO CHEW
81.0000 mg | CHEWABLE_TABLET | Freq: Every day | ORAL | Status: DC
  Administered 2016-01-02 – 2016-01-03 (×2): 81 mg via ORAL
  Filled 2016-01-02 (×5): qty 1

## 2016-01-02 MED ORDER — LACTATED RINGERS IV SOLN
INTRAVENOUS | Status: DC
  Administered 2016-01-02: 15:00:00 via INTRAVENOUS
  Administered 2016-01-03: 100 mL/h via INTRAVENOUS
  Administered 2016-01-03: 21:00:00 via INTRAVENOUS

## 2016-01-02 MED ORDER — INSULIN ASPART 100 UNIT/ML ~~LOC~~ SOLN
0.0000 [IU] | Freq: Four times a day (QID) | SUBCUTANEOUS | Status: DC
  Administered 2016-01-02: 4 [IU] via SUBCUTANEOUS

## 2016-01-02 MED ORDER — OXYCODONE-ACETAMINOPHEN 5-325 MG PO TABS
2.0000 | ORAL_TABLET | Freq: Once | ORAL | Status: AC
  Administered 2016-01-02: 2 via ORAL
  Filled 2016-01-02: qty 2

## 2016-01-02 MED ORDER — PANTOPRAZOLE SODIUM 20 MG PO TBEC
20.0000 mg | DELAYED_RELEASE_TABLET | Freq: Two times a day (BID) | ORAL | Status: DC
  Administered 2016-01-02 – 2016-01-03 (×4): 20 mg via ORAL
  Filled 2016-01-02 (×7): qty 1

## 2016-01-02 MED ORDER — BUTALBITAL-APAP-CAFFEINE 50-325-40 MG PO TABS
1.0000 | ORAL_TABLET | ORAL | Status: DC | PRN
  Administered 2016-01-02 (×2): 1 via ORAL
  Filled 2016-01-02 (×2): qty 1

## 2016-01-02 MED ORDER — MAGNESIUM SULFATE 50 % IJ SOLN
2.0000 g/h | INTRAVENOUS | Status: DC
  Administered 2016-01-02 – 2016-01-03 (×2): 2 g/h via INTRAVENOUS
  Filled 2016-01-02 (×3): qty 80

## 2016-01-02 NOTE — Consult Note (Signed)
Asked by Dr.Dillard to provide prenatal consultation for patient at risk for preterm delivery due to preeclampsia.  Mother is 30 y.o. G3 P1-1-0-2 who is now 31.[redacted] weeks EGA, with increased BP, severe HA, edema and excessive weight gain.  She also has placenta previa and recently was diagnosed with gestational DM.  A previous pregnancy was complicated by severe preeclampsia with pulmonary edema ([place] 28 wks [place]  She has received her first dose of BMZ (about 0100 today) is being treated with MgSO4 and antihypertensives.   Discussed usual expectations for preterm infant at 2231 - [redacted] weeks gestation, including possible needs for DR resuscitation, respiratory support, IV access, and blood products.  Presented optimistic long-term outcome with projected possible length of stay in NICU until 37 - [redacted] wks EGA.  Discussed advantages of feeding with mother's milk.  She had difficulty with breast feeding 2nd child and does not intend to breast feed long-term but agreed to to pump postnatally to provide breast milk for tube and/or bottle feedings.  [place] lives in [place] and [facility].  We discussed [facility] when he is medically ready for Level 2 care.  Patient was attentive, had appropriate questions, and was appreciative of my input.  Thank you for consulting Neonatology.  Total time 30 minutes  JWimmer, MD

## 2016-01-02 NOTE — Progress Notes (Signed)
  Nutrition Dx: Food and nutrition-related knowledge deficit r/t no previous education aeb newly diagnosed GDM.   Nutrition education consult for Carbohydrate Modified Gestational Diabetic Diet completed.  "Meal  plan for gestational diabetics" handout given to patient.  Basic concepts reviewed.  Questions answered.  Patient verbalizes understanding. Pt to be in house until delivery. Has family Hx of DM. Adjusted diet order to allow double protein portions, as pt is hungry after meals.  Elisabeth CaraKatherine Javeon Macmurray M.Odis LusterEd. R.D. LDN Neonatal Nutrition Support Specialist/RD III Pager [phone]      Phone [phone]

## 2016-01-02 NOTE — Consult Note (Signed)
Maternal Fetal Medicine Consultation  Requesting Provider(s): Jaymes Graff, MD  Reason for consultation: Preeclampsia  HPI: Stephanie [name] is a 30 yo G3P1102, EDD 03/04/2016 who is currently at 31w 1d seen for consultation due to acutely worsening blood pressures and headache.  Stephanie [name] reports a history of severe headache over the last 4 days - frontal "different from other headaches" associated with some visual changes.  She reports that the headache has not improved with Tylenol or Fioricet.   Additionally, she reports at 12 lb weight increase over the last 1-2 weeks.  Her past OB history is significant for severe preeclampsia and pulmonary edema resulting in a 28 week delivery in [place].  She was admitted to the ICU for approximately one month (? [facility]) - records not available.  Her subsequent pregnancy was a term C-section without complications.  This pregnancy is with a different father of the baby.  Stephanie [name] has been followed due to placenta previa and was recently diagnosed with Gestational diabetes.  Since admission, the patient has had some labile blood pressures with at least one severe range blood pressure (162/80).  OB History: OB History    Gravida Para Term Preterm AB TAB SAB Ectopic Multiple Living   PMH:  Past Medical History  Diagnosis Date  . Hypertension   . Preeclampsia   . Hypothyroidism   . Pulmonary edema     third trimester    PSH:  Past Surgical History  Procedure Laterality Date  . Cesarean section     Meds:  Current Facility-Administered Medications on File Prior to Encounter  Medication Dose Route Frequency Provider Last Rate Last Dose  . 0.9 %  sodium chloride infusion  250 mL Intravenous PRN Alphonzo Severance, CNM      . acetaminophen (TYLENOL) tablet 650 mg  650 mg Oral Q4H PRN Alphonzo Severance, CNM      . aspirin chewable tablet 81 mg  81 mg Oral Daily Alphonzo Severance, CNM   81 mg at 01/02/16 1042  . betamethasone  acetate-betamethasone sodium phosphate (CELESTONE) injection 12 mg  12 mg Intramuscular Q24H Alphonzo Severance, CNM   12 mg at 01/02/16 0006  . butalbital-acetaminophen-caffeine (FIORICET, ESGIC) 50-325-40 MG per tablet 1 tablet  1 tablet Oral Q4H PRN Alphonzo Severance, CNM   1 tablet at 01/02/16 0946  . calcium carbonate (TUMS - dosed in mg elemental calcium) chewable tablet 400 mg of elemental calcium  2 tablet Oral Q4H PRN Alphonzo Severance, CNM   400 mg of elemental calcium at 01/02/16 0947  . docusate sodium (COLACE) capsule 100 mg  100 mg Oral Daily Alphonzo Severance, CNM   100 mg at 01/02/16 0946  . hydrALAZINE (APRESOLINE) injection 10 mg  10 mg Intravenous Once PRN Alphonzo Severance, CNM      . insulin aspart (novoLOG) injection 0-10 Units  0-10 Units Subcutaneous QID Naima Dillard, MD      . labetalol (NORMODYNE,TRANDATE) injection 20-80 mg  20-80 mg Intravenous Q10 min PRN Alphonzo Severance, CNM      . prenatal multivitamin tablet 1 tablet  1 tablet Oral Q1200 Alphonzo Severance, CNM   1 tablet at 01/02/16 1307  . sodium chloride flush (NS) 0.9 % injection 3 mL  3 mL Intravenous Q12H Alphonzo Severance, CNM   3 mL at 01/02/16 1043  . sodium chloride flush (NS) 0.9 % injection 3 mL  3 mL Intravenous PRN Alphonzo Severance,  CNM      . zolpidem (AMBIEN) tablet 5 mg  5 mg Oral QHS PRN Alphonzo Severance, CNM       Current Outpatient Prescriptions on File Prior to Encounter  Medication Sig Dispense Refill  . acetaminophen (TYLENOL) 325 MG tablet Take 650 mg by mouth every 6 (six) hours as needed for mild pain or headache.    . Prenatal Vit-Fe Fumarate-FA (PRENATAL MULTIVITAMIN) TABS tablet Take 1 tablet by mouth daily at 12 noon.     Allergies: No Known Allergies   FH:  Family History  Problem Relation Age of Onset  . Hypertension Father    Soc:  Social History   Social History  . Marital Status: Single    Spouse Name: N/A  . Number of Children: N/A  . Years of Education: N/A   Occupational History  . Not on file.   Social  History Main Topics  . Smoking status: Never Smoker   . Smokeless tobacco: Never Used  . Alcohol Use: No  . Drug Use: No  . Sexual Activity: Yes    Birth Control/ Protection: None   Other Topics Concern  . Not on file   Social History Narrative    Review of Systems: no vaginal bleeding or cramping/contractions, no LOF, no nausea/vomiting. All other systems reviewed and are negative.  PE: VS: 134/76, 137/74, 162/80 (mostly 140-150/80-90's)   GEN: well-appearing female ABD: gravid, NT  Ultrasound: Single IUP at 31w 1d Hypertension, suspected preeclampsia The estimated fetal weight is at the 32nd %tile. Posterior, low lying placenta  Labs: CBC    Component Value Date/Time   WBC 14.0* 01/02/2016 0510   RBC 4.38 01/02/2016 0510   HGB 10.9* 01/02/2016 0510   HCT 33.6* 01/02/2016 0510   PLT 269 01/02/2016 0510   MCV 76.7* 01/02/2016 0510   MCH 24.9* 01/02/2016 0510   MCHC 32.4 01/02/2016 0510   RDW 13.9 01/02/2016 0510   LYMPHSABS 3.3 12/07/2015 0030   MONOABS 0.8 12/07/2015 0030   EOSABS 0.1 12/07/2015 0030   BASOSABS 0.1 12/07/2015 0030   CMP     Component Value Date/Time   NA 137 01/02/2016 0510   K 3.8 01/02/2016 0510   CL 109 01/02/2016 0510   CO2 20* 01/02/2016 0510   GLUCOSE 127* 01/02/2016 0510   GLUCOSE 128 01/02/2016 0510   BUN 9 01/02/2016 0510   CREATININE 0.47 01/02/2016 0510   CALCIUM 8.6* 01/02/2016 0510   PROT 6.8 01/02/2016 0510   ALBUMIN 2.7* 01/02/2016 0510   AST 18 01/02/2016 0510   ALT 10* 01/02/2016 0510   ALKPHOS 71 01/02/2016 0510   BILITOT 0.1* 01/02/2016 0510   GFRNONAA >60 01/02/2016 0510   GFRAA >60 01/02/2016 0510   Uric acid: 5.7 Protein/Creat: 0.28  A/P: 1) Single IUP at 31w 1d  2) Newly diagnosed Gestational diabetes - while recent dose of betamethasone may have obscured the diagnosis, feel that the values are high enough that would make this diagnosis despite the steroids.  Recommend checking fasting and post prandial  fingerstick values and initiate therapy if indicated.  3) Likely preeclampsia - the patient has had labile blood pressures- at least one value in the severe range but otherwise mostly in the 150/80-90 range.  She is not currently on anti-hypertensive medications.  I am concerned about the severe headaches and visual changes that the patient has described.  Concur with betamethasone - would recommend initiating Magnesium sulfate and continuing it until steroid complete.  If the patient  continues to have severe headaches that do not improve with medications and if she continues to have severe range blood pressures, would recommend delivery once steroid complete (tomorrow).  If she is clinically stable or improved, would discontinue Magnesium and resume expectant management.  I would recommend delivery by 34 weeks regardless, particularly with the patient's past history of severe preeclampsia with pulmonary edema.  4) Low lying posterior placenta - the leading edge of the placenta is approximately 1 cm from the internal os  Recommendations: 1) Would begin Magnesium sulfate now and continue at least until steroid complete (tomorrow morning) 2) Reevaluate the patient at that time - if the patient continues to have severe headaches that do not improve with medications and continues to have severe range blood pressures, would recommend moving toward delivery 3) If clinically improved / stable - discontinue magnesium sulfate tomorrow and resume expectant management 4) Recommend preeclampsia labs tomorrow and 2-3x weekly thereafter 5) At least daily fetal tracings / NSTs while hospitalized 6) If clinically stable, may consider adding antihypertensive medication if needed - my preference would be Procardia XL 30 mg and titrate as appropriate to maintain blood pressures in the 140/90 range.  Max dose 120 mg XL daily 7) Would move toward delivery for: persistent severe range blood pressures despite medications, LFTs  > 2x normal, thrombocytopenia (plts < 100k), Creat > 1.2 mg/dl, severe range headaches or non reassuring fetal testing 8) If otherwise stable - delivery at 34 weeks.   Thank you for the opportunity to be a part of the care of Pathmark StoresKrystin N [name]. Please contact our office if we can be of further assistance.   I spent approximately 30 minutes with this patient with over 50% of time spent in face-to-face counseling.  Alpha GulaPaul Phyllicia Dudek, MD Recommendations discussed with Dr. Normand Sloopillard

## 2016-01-02 NOTE — Progress Notes (Signed)
Inpatient Diabetes Program Recommendations  AACE/ADA: New Consensus Statement on Inpatient Glycemic Control (2015)  Target Ranges:  Prepandial:   less than 140 mg/dL      Peak postprandial:   less than 180 mg/dL (1-2 hours)      Critically ill patients:  140 - 180 mg/dL    Spoke with patient about Gestational DM diagnosis. Reviewed glucose goals. Spoke with patient about current inpatient regimen. Patient informed me that she will be staying inpatient until delivery of her son. I reviewed diet control and watching glucose levels while inpatient. She mentioned the cafeteria was very helpful in guiding her meal choices. Spoke about risk factors for future pregnancies and risk factors for developing DM type 2 later in life. Patient reports DM type 2 runs in her family already. Spoke with patient about getting her A1c checked yearly with her physical. Patient to review the Gestational Diabetes Educational booklet, if she has questions she will ask Engineer, manufacturingN staff. Will follow patient while inpatient.  Thanks,  Christena DeemShannon Avin Upperman RN, MSN, [facility] Inpatient Diabetes Coordinator Team Pager [phone] (8a-5p)

## 2016-01-02 NOTE — Progress Notes (Signed)
Patient ID: Stephanie [name], female   DOB: [birth year], 30 y.o.   MRN: [id number] Pt without complaints.  No leakage of fluid or VB.  Good FM.  She does have a HA and sees black spots  BP 133/74 mmHg  Pulse 77  Temp(Src) 97.9 F (36.6 C) (Oral)  Resp 20  Ht _0  (1.676 m)  Wt 255 lb 6.4 oz (115.849 kg)  BMI 41.24 kg/m2  SpO2 100%  LMP 05/21/2015 (LMP Unknown)  FHTS 140 reactive.  Good LTV  Toco none  Pt in NAD CV RRR Lungs CTAB abd  Gravid soft and NT GU no vb EXt no calf tenderness Results for orders placed or performed during the hospital encounter of 01/01/16 (from the past 72 hour(s))  Urinalysis, Routine w reflex microscopic (not at [facility])     Status: Abnormal   Collection Time: 01/01/16  8:18 PM  Result Value Ref Range   Color, Urine YELLOW YELLOW   APPearance CLEAR CLEAR   Specific Gravity, Urine >1.030 (H) 1.005 - 1.030   pH 6.0 5.0 - 8.0   Glucose, UA NEGATIVE NEGATIVE mg/dL   Hgb urine dipstick NEGATIVE NEGATIVE   Bilirubin Urine NEGATIVE NEGATIVE   Ketones, ur NEGATIVE NEGATIVE mg/dL   Protein, ur 30 (A) NEGATIVE mg/dL   Nitrite NEGATIVE NEGATIVE   Leukocytes, UA NEGATIVE NEGATIVE  Urine microscopic-add on     Status: Abnormal   Collection Time: 01/01/16  8:18 PM  Result Value Ref Range   Squamous Epithelial / LPF 0-5 (A) NONE SEEN   WBC, UA 0-5 0 - 5 WBC/hpf   RBC / HPF 0-5 0 - 5 RBC/hpf   Bacteria, UA FEW (A) NONE SEEN  Protein / creatinine ratio, urine     Status: Abnormal   Collection Time: 01/01/16  8:18 PM  Result Value Ref Range   Creatinine, Urine 236.00 mg/dL   Total Protein, Urine 67 mg/dL    Comment: NO NORMAL RANGE ESTABLISHED FOR THIS TEST   Protein Creatinine Ratio 0.28 (H) 0.00 - 0.15 mg/mg[Cre]  CBC     Status: Abnormal   Collection Time: 01/01/16  9:25 PM  Result Value Ref Range   WBC 12.5 (H) 4.0 - 10.5 K/uL   RBC 4.09 3.87 - 5.11 MIL/uL   Hemoglobin 10.1 (L) 12.0 - 15.0 g/dL   HCT 31.5 (L) 36.0 - 46.0 %   MCV 77.0 (L) 78.0 - 100.0  fL   MCH 24.7 (L) 26.0 - 34.0 pg   MCHC 32.1 30.0 - 36.0 g/dL   RDW 14.0 11.5 - 15.5 %   Platelets 254 150 - 400 K/uL  Comprehensive metabolic panel     Status: Abnormal   Collection Time: 01/01/16  9:25 PM  Result Value Ref Range   Sodium 137 135 - 145 mmol/L   Potassium 3.2 (L) 3.5 - 5.1 mmol/L   Chloride 108 101 - 111 mmol/L   CO2 19 (L) 22 - 32 mmol/L   Glucose, Bld 117 (H) 65 - 99 mg/dL   BUN 9 6 - 20 mg/dL   Creatinine, Ser 0.55 0.44 - 1.00 mg/dL   Calcium 8.5 (L) 8.9 - 10.3 mg/dL   Total Protein 6.4 (L) 6.5 - 8.1 g/dL   Albumin 2.6 (L) 3.5 - 5.0 g/dL   AST 16 15 - 41 U/L   ALT 12 (L) 14 - 54 U/L   Alkaline Phosphatase 66 38 - 126 U/L   Total Bilirubin 0.6 0.3 - 1.2 mg/dL  GFR calc non Af Amer >60 >60 mL/min   GFR calc Af Amer >60 >60 mL/min    Comment: (NOTE) The eGFR has been calculated using the CKD EPI equation. This calculation has not been validated in all clinical situations. eGFR's persistently <60 mL/min signify possible Chronic Kidney Disease.    Anion gap 10 5 - 15  Lactate dehydrogenase     Status: None   Collection Time: 01/01/16  9:25 PM  Result Value Ref Range   LDH 125 98 - 192 U/L  Uric acid     Status: None   Collection Time: 01/01/16  9:25 PM  Result Value Ref Range   Uric Acid, Serum 5.7 2.3 - 6.6 mg/dL  Type and screen [place]     Status: None   Collection Time: 01/01/16 11:58 PM  Result Value Ref Range   ABO/RH(D) O POS    Antibody Screen NEG    Sample Expiration 01/04/2016   CBC     Status: Abnormal   Collection Time: 01/02/16  5:10 AM  Result Value Ref Range   WBC 14.0 (H) 4.0 - 10.5 K/uL   RBC 4.38 3.87 - 5.11 MIL/uL   Hemoglobin 10.9 (L) 12.0 - 15.0 g/dL   HCT 33.6 (L) 36.0 - 46.0 %   MCV 76.7 (L) 78.0 - 100.0 fL   MCH 24.9 (L) 26.0 - 34.0 pg   MCHC 32.4 30.0 - 36.0 g/dL   RDW 13.9 11.5 - 15.5 %   Platelets 269 150 - 400 K/uL  Comprehensive metabolic panel     Status: Abnormal   Collection Time: 01/02/16   5:10 AM  Result Value Ref Range   Sodium 137 135 - 145 mmol/L   Potassium 3.8 3.5 - 5.1 mmol/L   Chloride 109 101 - 111 mmol/L   CO2 20 (L) 22 - 32 mmol/L   Glucose, Bld 127 (H) 65 - 99 mg/dL   BUN 9 6 - 20 mg/dL   Creatinine, Ser 0.47 0.44 - 1.00 mg/dL   Calcium 8.6 (L) 8.9 - 10.3 mg/dL   Total Protein 6.8 6.5 - 8.1 g/dL   Albumin 2.7 (L) 3.5 - 5.0 g/dL   AST 18 15 - 41 U/L   ALT 10 (L) 14 - 54 U/L   Alkaline Phosphatase 71 38 - 126 U/L   Total Bilirubin 0.1 (L) 0.3 - 1.2 mg/dL   GFR calc non Af Amer >60 >60 mL/min   GFR calc Af Amer >60 >60 mL/min    Comment: (NOTE) The eGFR has been calculated using the CKD EPI equation. This calculation has not been validated in all clinical situations. eGFR's persistently <60 mL/min signify possible Chronic Kidney Disease.    Anion gap 8 5 - 15  Lactate dehydrogenase     Status: None   Collection Time: 01/02/16  5:10 AM  Result Value Ref Range   LDH 126 98 - 192 U/L  Uric acid     Status: None   Collection Time: 01/02/16  5:10 AM  Result Value Ref Range   Uric Acid, Serum 5.4 2.3 - 6.6 mg/dL  Glucose, fasting gestational     Status: None   Collection Time: 01/02/16  5:10 AM  Result Value Ref Range   Glucose, Fasting-Gestational 128 mg/dL  Glucose, 1 hour gestational     Status: Abnormal   Collection Time: 01/02/16  6:56 AM  Result Value Ref Range   Glucose, 1 Hour-Gestational 196 (H) 70 - 189 mg/dL  Glucose, 2 hour gestational     Status: Abnormal   Collection Time: 01/02/16  8:13 AM  Result Value Ref Range   Glucose, 2 Hour-Gestational 226 (H) 70 - 164 mg/dL  Glucose, 3 hour gestational     Status: Abnormal   Collection Time: 01/02/16  9:05 AM  Result Value Ref Range   Glucose, GTT - 3 Hour 172 (H) 70 - 144 mg/dL    Assessment and Plan [redacted]w[redacted]d PIH doing 24 hour urine and monitoring BPS.   Pt did receive BMZ WIll get MFM consult and UKoreathis morning GDM three hour abnormal.  Pt did receive steroids which is contributing to  elevated BS Will check fasting and 2 hr pp blood sugars and cover with ISS Will obtain NICU consult and DM consult

## 2016-01-02 NOTE — Progress Notes (Addendum)
Inpatient Diabetes Program Recommendations  Diabetes Treatment Program Recommendations  ADA Standards of Care 2016 Diabetes in Pregnancy Target Glucose Ranges:  Fasting: 60 - 90 mg/dL Preprandial: 60 - 782105 mg/dL 1 hr postprandial: Less than 140 mg/dL (from first bite of meal) 2 hr postprandial: Less than 120 mg/dL (from first bite of meal)   Results for Stephanie [name], Stephanie [name] (MRN [id number]) as of 01/02/2016 11:49  Ref. Range 01/01/2016 21:25 01/02/2016 05:10 01/02/2016 09:05  Glucose Latest Ref Range: 65-99 mg/dL 578117 (H) 469127 (H)   Glucose, GTT - 3 Hour Latest Ref Range: 70-144 mg/dL   629172 (H)   Review of Glycemic Control   Current orders for Inpatient glycemic control: Novolog 4 units QID PRN (based on correction)  Inpatient Diabetes Program Recommendations:  Insulin-Correction: Noted patient has an order for Novolog 4 units QID PRN with a correction scale that ranges from 0-10 units based on glucose. Order needs to be modified to Novolog 0-10 units QID (fasting and 2 hour post prandial from first bite of meal). A1C: Please consider ordering an A1C to evaluate glycemic control over the past 2-3 months. GDM Dx: Patient received Betamethasone 12 mg at 00:06 on 01/02/16 and initial glucose with 3 hour GTT was 172 mg/dl which is definitely impacted by the Betamethasone patient has already received. Patient will receive another dose of Betamethasone 12 mg at midnight. May want to consider repeating 3 hour GTT at least 1-2 weeks after last dose of Betamethasone to confirm dx of GDM.   Addendum 01/02/16@12 :27-Talked with Dr. Normand Sloopillard and she states that she is diagnosis patient with GDM based on abnormal 1 hour GTT in office, abnormal 3 hour GTT, and risk factors. Therefore, patient will need to be educated by bedside nursing on GDM. Modified Novolog order to reflect Novolog 0-10 units QID (fasting and 2 H PP) as Dr. Normand Sloopillard has noted based on glucose. Talked with Sunny SchleinFelicia, RN and asked that patient be provided  with GDM booklet and nursing begin educating patient about GDM. Diabetes Coordinator will continue to follow.  Thanks, Stephanie PennerMarie Djeneba Barsch, RN, MSN, CDE Diabetes Coordinator Inpatient Diabetes Program [phone] (Team Pager from 8am to 5pm) [phone] ([facility] office) [phone] [facility] office) [phone] [facility] office)

## 2016-01-02 NOTE — Progress Notes (Addendum)
Hospital day #1 Pregnancy at 2824w1d--Hypertension, suspect preeclampsia, GDM (diet controlled; on insulin sliding scale) and low lying posterior placenta.   S:  Worsening h/a (frontal, at level of eyebrows, radiating to temples) since ~ 7:30 PM this evening. Rates pain as 9/10. Reports very active fetus, blurred vision and edema. Denies nausea, flushing, warmth, somnolence, slurred speech, weakness, CP or SOB. Perception of contractions: none. Vaginal bleeding: none.  Vaginal discharge:  no significant change.  O: BP 140/82 mmHg  Pulse 91  Temp(Src) 98.4 F (36.9 C) (Oral)  Resp 18  Ht 5\' 6"  (1.676 m)  Wt 115.849 kg (255 lb 6.4 oz)  BMI 41.24 kg/m2  SpO2 99%  LMP 05/21/2015 (LMP Unknown)  Today's Vitals   01/02/16 2221 01/02/16 2227 01/02/16 2304 01/02/16 2320  BP:  162/96 140/82   Pulse:  88 91   Temp:      TempSrc:      Resp: 20 20 20 18   Height:      Weight:      SpO2:      PainSc: 8    8         Gen: NAD, generalized edema      Lungs: CTAB      CV: RRR w/o M/R/G       Uterus gravid, soft and non-tender      Fetal tracings: BL 130 w/ moderate variability, +accels, no decels      Contractions: None         Extremities: 2+ calf/ankle edema. 2+ pitting pedal edema, R>L. Neg Homans bilaterally. No palpable cords. No redness or tenderness to calves. Cool to the touch --- no suspicion for DVT.       1+ DTRs, no clonus.       SCDs on and functioning.       Seen by NICU, MFM and Diabetic Coordinators.       24hr urine up around 0015 and 2nd BMZ injection due at 0006.  I/O last 3 completed shifts: In: 1310.4 [P.O.:820; I.V.:490.4] Out: 550 [Urine:550] Total I/O In: 1160 [P.O.:660; I.V.:500] Out: 1000 [Urine:1000]             Labs:   Results for orders placed or performed during the hospital encounter of 01/01/16 (from the past 24 hour(s))  [facility]     Status: None   Collection Time: 01/01/16 11:58 PM  Result Value Ref Range    ABO/RH(D) O POS    Antibody Screen NEG    Sample Expiration 01/04/2016   CBC     Status: Abnormal   Collection Time: 01/02/16  5:10 AM  Result Value Ref Range   WBC 14.0 (H) 4.0 - 10.5 K/uL   RBC 4.38 3.87 - 5.11 MIL/uL   Hemoglobin 10.9 (L) 12.0 - 15.0 g/dL   HCT 91.433.6 (L) 78.236.0 - 95.646.0 %   MCV 76.7 (L) 78.0 - 100.0 fL   MCH 24.9 (L) 26.0 - 34.0 pg   MCHC 32.4 30.0 - 36.0 g/dL   RDW 21.313.9 08.611.5 - 57.815.5 %   Platelets 269 150 - 400 K/uL  Comprehensive metabolic panel     Status: Abnormal   Collection Time: 01/02/16  5:10 AM  Result Value Ref Range   Sodium 137 135 - 145 mmol/L   Potassium 3.8 3.5 - 5.1 mmol/L   Chloride 109 101 - 111 mmol/L   CO2 20 (L) 22 - 32 mmol/L   Glucose, Bld 127 (H) 65 -  99 mg/dL   BUN 9 6 - 20 mg/dL   Creatinine, Ser 1.61 0.44 - 1.00 mg/dL   Calcium 8.6 (L) 8.9 - 10.3 mg/dL   Total Protein 6.8 6.5 - 8.1 g/dL   Albumin 2.7 (L) 3.5 - 5.0 g/dL   AST 18 15 - 41 U/L   ALT 10 (L) 14 - 54 U/L   Alkaline Phosphatase 71 38 - 126 U/L   Total Bilirubin 0.1 (L) 0.3 - 1.2 mg/dL   GFR calc non Af Amer >60 >60 mL/min   GFR calc Af Amer >60 >60 mL/min   Anion gap 8 5 - 15  Lactate dehydrogenase     Status: None   Collection Time: 01/02/16  5:10 AM  Result Value Ref Range   LDH 126 98 - 192 U/L  Uric acid     Status: None   Collection Time: 01/02/16  5:10 AM  Result Value Ref Range   Uric Acid, Serum 5.4 2.3 - 6.6 mg/dL  Glucose, fasting gestational     Status: None   Collection Time: 01/02/16  5:10 AM  Result Value Ref Range   Glucose, Fasting-Gestational 128 mg/dL  Glucose, 1 hour gestational     Status: Abnormal   Collection Time: 01/02/16  6:56 AM  Result Value Ref Range   Glucose, 1 Hour-Gestational 196 (H) 70 - 189 mg/dL  Glucose, 2 hour gestational     Status: Abnormal   Collection Time: 01/02/16  8:13 AM  Result Value Ref Range   Glucose, 2 Hour-Gestational 226 (H) 70 - 164 mg/dL  Glucose, 3 hour gestational     Status: Abnormal   Collection Time:  01/02/16  9:05 AM  Result Value Ref Range   Glucose, GTT - 3 Hour 172 (H) 70 - 144 mg/dL  Glucose, capillary     Status: Abnormal   Collection Time: 01/02/16 12:36 PM  Result Value Ref Range   Glucose-Capillary 107 (H) 65 - 99 mg/dL   Comment 1 Notify RN    Comment 2 Document in Chart   Glucose, capillary     Status: Abnormal   Collection Time: 01/02/16  8:28 PM  Result Value Ref Range   Glucose-Capillary 123 (H) 65 - 99 mg/dL  Magnesium     Status: Abnormal   Collection Time: 01/02/16 10:06 PM  Result Value Ref Range   Magnesium 3.9 (H) 1.7 - 2.4 mg/dL         Meds:  . aspirin  81 mg Oral Daily  . betamethasone acetate-betamethasone sodium phosphate  12 mg Intramuscular Q24H  . docusate sodium  100 mg Oral Daily  . insulin aspart  0-10 Units Subcutaneous QID  . pantoprazole  20 mg Oral BID  . prenatal multivitamin  1 tablet Oral Q1200  . sodium chloride flush  3 mL Intravenous Q12H  sodium chloride, acetaminophen, butalbital-acetaminophen-caffeine, calcium carbonate, hydrALAZINE, labetalol, sodium chloride flush, zolpidem  A: [redacted]w[redacted]d with Hypertension, suspect preeclampsia, on Magnesium. Also w/ GDM and low lying posterior placenta (leading edge ~ 1 cm from internal os).     gradually worsening.     Cat 1 FHRT.     Good uop.     H/O preE     H/O c-section x 2     Morbid Obesity     Rubella Non-Immune     GERD     H/O Restless Leg Syndrome  P: Consulted Dr. Dion Body. Will proceed w/ Percocet 2 tabs and obtain Magnesium level. Pt to remain  hospitalized until delivery. CTO closely.  MFM Recommendations on 01/02/16: 1) Would begin Magnesium sulfate now and continue at least until steroid complete (tomorrow morning) 2) Reevaluate the patient at that time - if the patient continues to have severe headaches that do not improve with medications and continues to have severe range blood pressures, would recommend moving toward delivery 3) If clinically improved / stable - discontinue  magnesium sulfate tomorrow and resume expectant management 4) Recommend preeclampsia labs tomorrow and 2-3x weekly thereafter 5) At least daily fetal tracings / NSTs while hospitalized 6) Serial ultrasounds for growth every 3-4 wks for the remainder of pregnancy if undelivered 7) If clinically stable, may consider adding antihypertensive medication if needed - my preference would be Procardia XL 30 mg and titrate as appropriate to maintain blood pressures in the 140/90 range. Max dose 120 mg XL daily 8) Would move toward delivery for: persistent severe range blood pressures despite medications, LFTs > 2x normal, thrombocytopenia (plts < 100k), Creat > 1.2 mg/dl, severe range headaches or non reassuring fetal testing 9) If otherwise stable - delivery at 34 weeks.        Sherre Scarlet CNM, MS 01/02/2016 10:30 PM  ADDENDUM: Magnesium level normal at 3.9.   Sherre Scarlet, CNM 01/02/16, 11 PM

## 2016-01-03 DIAGNOSIS — O1414 Severe pre-eclampsia complicating childbirth: Secondary | ICD-10-CM | POA: Diagnosis present

## 2016-01-03 DIAGNOSIS — O34211 Maternal care for low transverse scar from previous cesarean delivery: Secondary | ICD-10-CM | POA: Diagnosis present

## 2016-01-03 DIAGNOSIS — Z8672 Personal history of thrombophlebitis: Secondary | ICD-10-CM | POA: Diagnosis not present

## 2016-01-03 DIAGNOSIS — O9902 Anemia complicating childbirth: Secondary | ICD-10-CM | POA: Diagnosis present

## 2016-01-03 DIAGNOSIS — G2581 Restless legs syndrome: Secondary | ICD-10-CM | POA: Diagnosis present

## 2016-01-03 DIAGNOSIS — E039 Hypothyroidism, unspecified: Secondary | ICD-10-CM | POA: Diagnosis present

## 2016-01-03 DIAGNOSIS — O99284 Endocrine, nutritional and metabolic diseases complicating childbirth: Secondary | ICD-10-CM | POA: Diagnosis present

## 2016-01-03 DIAGNOSIS — Z8249 Family history of ischemic heart disease and other diseases of the circulatory system: Secondary | ICD-10-CM | POA: Diagnosis not present

## 2016-01-03 DIAGNOSIS — O99214 Obesity complicating childbirth: Secondary | ICD-10-CM | POA: Diagnosis present

## 2016-01-03 DIAGNOSIS — I10 Essential (primary) hypertension: Secondary | ICD-10-CM

## 2016-01-03 DIAGNOSIS — Z3A34 34 weeks gestation of pregnancy: Secondary | ICD-10-CM | POA: Diagnosis not present

## 2016-01-03 DIAGNOSIS — O133 Gestational [pregnancy-induced] hypertension without significant proteinuria, third trimester: Secondary | ICD-10-CM | POA: Diagnosis present

## 2016-01-03 DIAGNOSIS — O24429 Gestational diabetes mellitus in childbirth, unspecified control: Secondary | ICD-10-CM | POA: Diagnosis present

## 2016-01-03 DIAGNOSIS — Z6841 Body Mass Index (BMI) 40.0 and over, adult: Secondary | ICD-10-CM | POA: Diagnosis not present

## 2016-01-03 LAB — COMPREHENSIVE METABOLIC PANEL
ALBUMIN: 2.4 g/dL — AB (ref 3.5–5.0)
ALK PHOS: 61 U/L (ref 38–126)
ALT: 10 U/L — ABNORMAL LOW (ref 14–54)
AST: 16 U/L (ref 15–41)
Anion gap: 9 (ref 5–15)
BILIRUBIN TOTAL: 0.4 mg/dL (ref 0.3–1.2)
BUN: 8 mg/dL (ref 6–20)
CALCIUM: 7.6 mg/dL — AB (ref 8.9–10.3)
CO2: 19 mmol/L — ABNORMAL LOW (ref 22–32)
Chloride: 107 mmol/L (ref 101–111)
Creatinine, Ser: 0.46 mg/dL (ref 0.44–1.00)
GFR calc Af Amer: >60 mL/min (ref >60)
GFR calc non Af Amer: >60 mL/min (ref >60)
GLUCOSE: 122 mg/dL — AB (ref 65–99)
Potassium: 3.6 mmol/L (ref 3.5–5.1)
Sodium: 135 mmol/L (ref 135–145)
TOTAL PROTEIN: 6.2 g/dL — AB (ref 6.5–8.1)

## 2016-01-03 LAB — CBC WITH DIFFERENTIAL/PLATELET
BASOS ABS: 0 10*3/uL (ref 0.0–0.1)
BASOS PCT: 0 %
Eosinophils Absolute: 0 10*3/uL (ref 0.0–0.7)
Eosinophils Relative: 0 %
HEMATOCRIT: 31.1 % — AB (ref 36.0–46.0)
HEMOGLOBIN: 9.7 g/dL — AB (ref 12.0–15.0)
Lymphocytes Relative: 16 %
Lymphs Abs: 2.2 10*3/uL (ref 0.7–4.0)
MCH: 23.9 pg — ABNORMAL LOW (ref 26.0–34.0)
MCHC: 31.2 g/dL (ref 30.0–36.0)
MCV: 76.6 fL — ABNORMAL LOW (ref 78.0–100.0)
MONOS PCT: 3 %
Monocytes Absolute: 0.4 10*3/uL (ref 0.1–1.0)
NEUTROS ABS: 11.2 10*3/uL — AB (ref 1.7–7.7)
NEUTROS PCT: 81 %
Platelets: 263 10*3/uL (ref 150–400)
RBC: 4.06 MIL/uL (ref 3.87–5.11)
RDW: 14.1 % (ref 11.5–15.5)
WBC: 13.8 10*3/uL — ABNORMAL HIGH (ref 4.0–10.5)

## 2016-01-03 LAB — GLUCOSE, CAPILLARY
GLUCOSE-CAPILLARY: 112 mg/dL — AB (ref 65–99)
GLUCOSE-CAPILLARY: 97 mg/dL (ref 65–99)
GLUCOSE-CAPILLARY: 118 mg/dL — AB (ref 65–99)
Glucose-Capillary: 101 mg/dL — ABNORMAL HIGH (ref 65–99)
Glucose-Capillary: 107 mg/dL — ABNORMAL HIGH (ref 65–99)

## 2016-01-03 LAB — PROTEIN, URINE, 24 HOUR
COLLECTION INTERVAL-UPROT: 24 h
PROTEIN, URINE: 20 mg/dL
Protein, 24H Urine: 560 mg/d — ABNORMAL HIGH (ref 50–100)
URINE TOTAL VOLUME-UPROT: 2800 mL

## 2016-01-03 LAB — CREATININE CLEARANCE, URINE, 24 HOUR
Collection Interval-CRCL: 24 hours
Creatinine Clearance: 216 mL/min — ABNORMAL HIGH (ref 75–115)
Creatinine, 24H Ur: 1464 mg/d (ref 600–1800)
Creatinine, Urine: 52.29 mg/dL
Urine Total Volume-CRCL: 2800 mL

## 2016-01-03 LAB — MAGNESIUM: MAGNESIUM: 3.6 mg/dL — AB (ref 1.7–2.4)

## 2016-01-03 LAB — HEMOGLOBIN A1C
Hgb A1c MFr Bld: 5.7 % — ABNORMAL HIGH (ref 4.8–5.6)
MEAN PLASMA GLUCOSE: 117 mg/dL

## 2016-01-03 MED ORDER — OXYCODONE-ACETAMINOPHEN 5-325 MG PO TABS: 2.0000 | ORAL_TABLET | Freq: Once | ORAL | Status: DC

## 2016-01-03 MED ORDER — OXYCODONE-ACETAMINOPHEN 5-325 MG PO TABS
2.0000 | ORAL_TABLET | Freq: Once | ORAL | Status: AC
  Administered 2016-01-03: 2 via ORAL
  Filled 2016-01-03: qty 2

## 2016-01-03 NOTE — Progress Notes (Signed)
Initial Nutrition Assessment  DOCUMENTATION CODES:  Obesity unspecified  INTERVENTION:  Gestational Carbohydrate modified diet Double protein portions allowed  NUTRITION DIAGNOSIS:  Increased nutrient needs related to  (pregnancy and fetal growth requirements) as evidenced by  (31 weeks IUP).  GOAL:  Patient will meet greater than or equal to 90% of their needs  MONITOR:  Weight trends  REASON FOR ASSESSMENT:  Gestational Diabetes, Antenatal   ASSESSMENT:  31 2/7 weeks, pre-eclampsia. Pre  preg weight 240 lbs per PNR, BMI 38.7. 15 Lb weight gain. Diet tolerated well. Brief diet education/ questions answered  on 6/7  Diet Order:  Diet gestational carb mod Room service appropriate?: Yes; Fluid consistency:: Thin  Skin:  Reviewed, no issues  Height:   Ht Readings from Last 1 Encounters:  01/01/16 5\' 6"  (1.676 m)   Weight:   Wt Readings from Last 1 Encounters:  01/02/16 255 lb 6.4 oz (115.849 kg)    Ideal Body Weight:    130 lbs  BMI:  Body mass index is 41.24 kg/(m^2).  Estimated Nutritional Needs:  Kcal:  2300-2500 Protein:  100-110 g Fluid:  2.6 L  EDUCATION NEEDS:  Education needs addressed 01/02/16  Elisabeth CaraKatherine Mileigh Tilley M.Odis LusterEd. R.D. LDN Neonatal Nutrition Support Specialist/RD III Pager [phone]      Phone [phone]

## 2016-01-03 NOTE — Progress Notes (Addendum)
Patient ID: Stephanie [name], female   DOB: 07/26/1986, 30 y.o.   MRN: [id number]  HD: # 3  On antepartum magnesium sulfate.   Subjective:  Patient continues with headache but has improved from before, intensity now is 7/10.  She denies visual changes.  She denies nausea,vomiting, chest pain, shortness of breath, abdominal or epigastric pain.   She is tolerating ADA diet.  She feels hot on the magnesium sulfate.  She denies contractions, vaginal bleeding, leakage of fluid.  With normal gross fetal movement.    Objective: I have reviewed patient's vitals and labs.  Filed Vitals:   01/03/16 0904 01/03/16 1004 01/03/16 1104 01/03/16 1204  BP: 126/72 140/84 143/93 126/71  Pulse: 78 81 73 78  Temp:   98.9 F (37.2 C) 98 F (36.7 C)  TempSrc:   Oral Oral  Resp: 18 16 18 16   Height:      Weight:      SpO2:       General: alert, cooperative and no distress.  Chatting comfortably, laughing appropriately.  Resp: clear to auscultation bilaterally Cardio: regular rate and rhythm, S1, S2 normal, no murmur, click, rub or gallop GI: soft, non-tender; bowel sounds normal; no masses,  no organomegaly.  Gravid.  Extremities: extremities normal, atraumatic, no cyanosis. With 3+ edema bilaterally on legs.  1+ reflexes on patellar reflex bilaterally  Input/output over 3 hrs on record (0500- 0800 on 01/03/16): 1075cc/1000cc.   EFM reviewed at 1230pm 01/03/16: Baseline 125, mod. Variability, reactive.  TOCO: No contractions.   CBC    Component Value Date/Time   WBC 13.8* 01/03/2016 0537   RBC 4.06 01/03/2016 0537   HGB 9.7* 01/03/2016 0537   HCT 31.1* 01/03/2016 0537   PLT 263 01/03/2016 0537   MCV 76.6* 01/03/2016 0537   MCH 23.9* 01/03/2016 0537   MCHC 31.2 01/03/2016 0537   RDW 14.1 01/03/2016 0537   LYMPHSABS 2.2 01/03/2016 0537   MONOABS 0.4 01/03/2016 0537   EOSABS 0.0 01/03/2016 0537   BASOSABS 0.0 01/03/2016 0537   CMP     Component Value Date/Time   NA 135 01/03/2016 0537   K 3.6  01/03/2016 0537   CL 107 01/03/2016 0537   CO2 19* 01/03/2016 0537   GLUCOSE 122* 01/03/2016 0537   GLUCOSE 128 01/02/2016 0510   BUN 8 01/03/2016 0537   CREATININE 0.46 01/03/2016 0537   CALCIUM 7.6* 01/03/2016 0537   PROT 6.2* 01/03/2016 0537   ALBUMIN 2.4* 01/03/2016 0537   AST 16 01/03/2016 0537   ALT 10* 01/03/2016 0537   ALKPHOS 61 01/03/2016 0537   BILITOT 0.4 01/03/2016 0537   GFRNONAA >60 01/03/2016 0537   GFRAA >60 01/03/2016 0537   6/8: Magnesium 3.6.  Glucose capillary: 112/123/101/107 6/7: 24 hr total protein in urine: 560 mg.    Assessment/Plan:  30 y/o G3P1102 @ 4431 W 2 D  EGA with preeclampsia with severe features (based on headache symptoms and severe range BPs), with gestational diabetes, 2 prior cesarean sections, stable  -s/p MFM and NICU consult.  --Overall plan is for continued inpatient management until delivery at [redacted] weeks EGA by repeat Cesarean section.  Plan is for earlier delivery if with worsening maternal or fetal status.  -MFM recommendations: Dr. Harlon FlorWhitaker as per consult note 01/02/2016:   "1) Would begin Magnesium sulfate now and continue at least until steroid complete  2) Reevaluate the patient at that time - if the patient continues to have severe headaches that do not improve with  medications and continues to have severe range blood pressures, would recommend moving toward delivery 3) If clinically improved / stable - discontinue magnesium sulfate tomorrow and resume expectant management 4) Recommend preeclampsia labs tomorrow and 2-3x weekly thereafter 5) At least daily fetal tracings / NSTs while hospitalized 6) If clinically stable, may consider adding antihypertensive medication if needed - my preference would be Procardia XL 30 mg and titrate as appropriate to maintain blood pressures in the 140/90 range. Max dose 120 mg XL daily 7) Would move toward delivery for: persistent severe range blood pressures despite medications, LFTs > 2x normal,  thrombocytopenia (plts < 100k), Creat > 1.2 mg/dl, severe range headaches or non reassuring fetal testing 8) If otherwise stable - delivery at 34 weeks."  -Percocet for headache scheduled for 1530 today  -Reviewed with patient magnesium sulfate use, side effects as well as symptoms to alert nursing staff about - Continue with fasting and 2 hr postprandial FSG checks and insulin sliding scale as ordered.  On Carbohydrate modified diet.  - Preeclampsia labs and blood type labs Q 3 days  -Will be beta complete 6/9 at 0005.  Plan to d/c magnesium sulfate when Beta complete and c/w inpatient management as above recommendations.   Dr. Sallye Ober.

## 2016-01-03 NOTE — Progress Notes (Signed)
T/C Dr. Sallye OberKulwa regarding Mag Drip.  Mag drip will continue to run until patient is "betamethazone complete" , that time will be 0005 June 9.

## 2016-01-03 NOTE — Progress Notes (Addendum)
Still rating H/A 8/10, but was able to sleep. Feeling drowsy from the Percocet. Still w/ blurred vision. Denies nausea, flushing, warmth, somnolence, slurred speech, weakness, CP or SOB.  Today's Vitals   01/03/16 0403 01/03/16 0500 01/03/16 0600 01/03/16 0700  BP: 133/73     Pulse: 81     Temp: 97.9 F (36.6 C)     TempSrc: Oral     Resp: Height:      Weight:      SpO2:      PainSc: Asleep       I/O last 3 completed shifts: In: 3590.4 [P.O.:1600; I.V.:1990.4] Out: 2550 [Urine:2550]       Results for orders placed or performed during the hospital encounter of 01/01/16 (from the past 24 hour(s))  Glucose, 2 hour gestational     Status: Abnormal   Collection Time: 01/02/16  8:13 AM  Result Value Ref Range   Glucose, 2 Hour-Gestational 226 (H) 70 - 164 mg/dL  Glucose, 3 hour gestational     Status: Abnormal   Collection Time: 01/02/16  9:05 AM  Result Value Ref Range   Glucose, GTT - 3 Hour 172 (H) 70 - 144 mg/dL  Glucose, capillary     Status: Abnormal   Collection Time: 01/02/16 12:36 PM  Result Value Ref Range   Glucose-Capillary 107 (H) 65 - 99 mg/dL   Comment 1 Notify RN    Comment 2 Document in Chart   Glucose, capillary     Status: Abnormal   Collection Time: 01/02/16  8:28 PM  Result Value Ref Range   Glucose-Capillary 123 (H) 65 - 99 mg/dL  Magnesium     Status: Abnormal   Collection Time: 01/02/16 10:06 PM  Result Value Ref Range   Magnesium 3.9 (H) 1.7 - 2.4 mg/dL  Magnesium     Status: Abnormal   Collection Time: 01/03/16  5:37 AM  Result Value Ref Range   Magnesium 3.6 (H) 1.7 - 2.4 mg/dL  CBC with Differential/Platelet     Status: Abnormal   Collection Time: 01/03/16  5:37 AM  Result Value Ref Range   WBC 13.8 (H) 4.0 - 10.5 K/uL   RBC 4.06 3.87 - 5.11 MIL/uL   Hemoglobin 9.7 (L) 12.0 - 15.0 g/dL   HCT 16.1 (L) 09.6 - 04.5 %   MCV 76.6 (L) 78.0 - 100.0 fL   MCH 23.9 (L) 26.0 - 34.0 pg   MCHC 31.2 30.0 - 36.0 g/dL   RDW 40.9 81.1 -  91.4 %   Platelets 263 150 - 400 K/uL   Neutrophils Relative % 81 %   Neutro Abs 11.2 (H) 1.7 - 7.7 K/uL   Lymphocytes Relative 16 %   Lymphs Abs 2.2 0.7 - 4.0 K/uL   Monocytes Relative 3 %   Monocytes Absolute 0.4 0.1 - 1.0 K/uL   Eosinophils Relative 0 %   Eosinophils Absolute 0.0 0.0 - 0.7 K/uL   Basophils Relative 0 %   Basophils Absolute 0.0 0.0 - 0.1 K/uL  Comprehensive metabolic panel     Status: Abnormal   Collection Time: 01/03/16  5:37 AM  Result Value Ref Range   Sodium 135 135 - 145 mmol/L   Potassium 3.6 3.5 - 5.1 mmol/L   Chloride 107 101 - 111 mmol/L   CO2 19 (L) 22 - 32 mmol/L   Glucose, Bld 122 (H) 65 - 99 mg/dL   BUN 8 6 - 20 mg/dL   Creatinine, Ser  0.46 0.44 - 1.00 mg/dL   Calcium 7.6 (L) 8.9 - 10.3 mg/dL   Total Protein 6.2 (L) 6.5 - 8.1 g/dL   Albumin 2.4 (L) 3.5 - 5.0 g/dL   AST 16 15 - 41 U/L   ALT 10 (L) 14 - 54 U/L   Alkaline Phosphatase 61 38 - 126 U/L   Total Bilirubin 0.4 0.3 - 1.2 mg/dL   GFR calc non Af Amer >60 >60 mL/min   GFR calc Af Amer >60 >60 mL/min   Anion gap 9 5 - 15   Protein from 24 hr urine = 560. Cr CL high at 216. Hbg A1C = 5.7. GBS pending.  A: Preeclampsia w/ severe features.  Dr. Dion BodyVarnado updated. Will sign out to Dr. Sallye OberKulwa as oncoming MD.   Sherre ScarletKimberly Joakim [name], CNM 01/03/16, 6:45 AM

## 2016-01-04 ENCOUNTER — Inpatient Hospital Stay (HOSPITAL_COMMUNITY): Payer: Medicaid Other

## 2016-01-04 ENCOUNTER — Inpatient Hospital Stay (HOSPITAL_COMMUNITY): Payer: Medicaid Other | Admitting: Anesthesiology

## 2016-01-04 ENCOUNTER — Encounter (HOSPITAL_COMMUNITY): Payer: Self-pay | Admitting: Anesthesiology

## 2016-01-04 ENCOUNTER — Encounter (HOSPITAL_COMMUNITY)
Admission: AD | Disposition: A | Discharge status: Home / Self Care | Payer: Self-pay | Source: Ambulatory Visit | Attending: Obstetrics and Gynecology

## 2016-01-04 DIAGNOSIS — O1490 Unspecified pre-eclampsia, unspecified trimester: Secondary | ICD-10-CM

## 2016-01-04 DIAGNOSIS — O1410 Severe pre-eclampsia, unspecified trimester: Secondary | ICD-10-CM | POA: Diagnosis present

## 2016-01-04 DIAGNOSIS — J810 Acute pulmonary edema: Secondary | ICD-10-CM

## 2016-01-04 LAB — TYPE AND SCREEN
ABO/RH(D): O POS
Antibody Screen: NEGATIVE

## 2016-01-04 LAB — COMPREHENSIVE METABOLIC PANEL
ALBUMIN: 2.3 g/dL — AB (ref 3.5–5.0)
ALK PHOS: 60 U/L (ref 38–126)
ALT: 12 U/L — ABNORMAL LOW (ref 14–54)
ANION GAP: 8 (ref 5–15)
AST: 17 U/L (ref 15–41)
BILIRUBIN TOTAL: 0.2 mg/dL — AB (ref 0.3–1.2)
BUN: 13 mg/dL (ref 6–20)
CALCIUM: 8.1 mg/dL — AB (ref 8.9–10.3)
CO2: 23 mmol/L (ref 22–32)
Chloride: 107 mmol/L (ref 101–111)
Creatinine, Ser: 0.56 mg/dL (ref 0.44–1.00)
GFR calc Af Amer: >60 mL/min (ref >60)
GFR calc non Af Amer: >60 mL/min (ref >60)
GLUCOSE: 80 mg/dL (ref 65–99)
Potassium: 3.3 mmol/L — ABNORMAL LOW (ref 3.5–5.1)
Sodium: 138 mmol/L (ref 135–145)
TOTAL PROTEIN: 5.5 g/dL — AB (ref 6.5–8.1)

## 2016-01-04 LAB — LACTATE DEHYDROGENASE: LDH: 119 U/L (ref 98–192)

## 2016-01-04 LAB — CBC
HCT: 29.9 % — ABNORMAL LOW (ref 36.0–46.0)
HEMOGLOBIN: 9.6 g/dL — AB (ref 12.0–15.0)
MCH: 24.8 pg — ABNORMAL LOW (ref 26.0–34.0)
MCHC: 32.1 g/dL (ref 30.0–36.0)
MCV: 77.3 fL — ABNORMAL LOW (ref 78.0–100.0)
Platelets: 227 10*3/uL (ref 150–400)
RBC: 3.87 MIL/uL (ref 3.87–5.11)
RDW: 14.3 % (ref 11.5–15.5)
WBC: 12.8 10*3/uL — ABNORMAL HIGH (ref 4.0–10.5)

## 2016-01-04 LAB — CULTURE, BETA STREP (GROUP B ONLY)

## 2016-01-04 LAB — GLUCOSE, CAPILLARY: GLUCOSE-CAPILLARY: 94 mg/dL (ref 65–99)

## 2016-01-04 LAB — URIC ACID: URIC ACID, SERUM: 6 mg/dL (ref 2.3–6.6)

## 2016-01-04 LAB — MAGNESIUM: MAGNESIUM: 3.3 mg/dL — AB (ref 1.7–2.4)

## 2016-01-04 SURGERY — Surgical Case
Anesthesia: Spinal | Wound class: Clean Contaminated

## 2016-01-04 MED ORDER — OXYTOCIN 10 UNIT/ML IJ SOLN
40.0000 [IU] | INTRAVENOUS | Status: DC | PRN
  Administered 2016-01-04: 40 [IU] via INTRAVENOUS

## 2016-01-04 MED ORDER — SODIUM CHLORIDE 0.9% FLUSH: 3.0000 mL | INTRAVENOUS | Status: DC | PRN

## 2016-01-04 MED ORDER — MAGNESIUM SULFATE BOLUS VIA INFUSION
4.0000 g | Freq: Once | INTRAVENOUS | Status: AC
  Administered 2016-01-04: 4 g via INTRAVENOUS
  Filled 2016-01-04: qty 500

## 2016-01-04 MED ORDER — MEPERIDINE HCL 25 MG/ML IJ SOLN: 6.2500 mg | INTRAMUSCULAR | Status: DC | PRN

## 2016-01-04 MED ORDER — BUPIVACAINE HCL (PF) 0.25 % IJ SOLN
INTRAMUSCULAR | Status: DC | PRN
  Administered 2016-01-04: 20 mL

## 2016-01-04 MED ORDER — ONDANSETRON HCL 4 MG/2ML IJ SOLN
INTRAMUSCULAR | Status: DC | PRN
  Administered 2016-01-04: 4 mg via INTRAVENOUS

## 2016-01-04 MED ORDER — MORPHINE SULFATE (PF) 0.5 MG/ML IJ SOLN
INTRAMUSCULAR | Status: AC
  Filled 2016-01-04: qty 10

## 2016-01-04 MED ORDER — COCONUT OIL OIL
1.0000 "application " | TOPICAL_OIL | Status: DC | PRN
  Filled 2016-01-04: qty 120

## 2016-01-04 MED ORDER — NALBUPHINE HCL 10 MG/ML IJ SOLN: 5.0000 mg | Freq: Once | INTRAMUSCULAR | Status: DC | PRN

## 2016-01-04 MED ORDER — ACETAMINOPHEN 325 MG PO TABS: 650.0000 mg | ORAL_TABLET | ORAL | Status: DC | PRN

## 2016-01-04 MED ORDER — FERROUS SULFATE 325 (65 FE) MG PO TABS
325.0000 mg | ORAL_TABLET | Freq: Two times a day (BID) | ORAL | Status: DC
  Administered 2016-01-05 – 2016-01-07 (×5): 325 mg via ORAL
  Filled 2016-01-04 (×5): qty 1

## 2016-01-04 MED ORDER — TETANUS-DIPHTH-ACELL PERTUSSIS 5-2.5-18.5 LF-MCG/0.5 IM SUSP: 0.5000 mL | Freq: Once | INTRAMUSCULAR | Status: DC

## 2016-01-04 MED ORDER — NALBUPHINE HCL 10 MG/ML IJ SOLN: 5.0000 mg | INTRAMUSCULAR | Status: DC | PRN

## 2016-01-04 MED ORDER — IBUPROFEN 600 MG PO TABS: 600.0000 mg | ORAL_TABLET | Freq: Four times a day (QID) | ORAL | Status: DC | PRN

## 2016-01-04 MED ORDER — OXYCODONE-ACETAMINOPHEN 5-325 MG PO TABS
1.0000 | ORAL_TABLET | ORAL | Status: DC | PRN
  Administered 2016-01-04: 1 via ORAL
  Filled 2016-01-04: qty 1

## 2016-01-04 MED ORDER — MENTHOL 3 MG MT LOZG: 1.0000 | LOZENGE | OROMUCOSAL | Status: DC | PRN

## 2016-01-04 MED ORDER — SENNOSIDES-DOCUSATE SODIUM 8.6-50 MG PO TABS
2.0000 | ORAL_TABLET | ORAL | Status: DC
  Administered 2016-01-05 – 2016-01-06 (×2): 2 via ORAL
  Filled 2016-01-04 (×2): qty 2

## 2016-01-04 MED ORDER — LACTATED RINGERS IV SOLN
INTRAVENOUS | Status: DC
  Administered 2016-01-04 – 2016-01-05 (×2): via INTRAVENOUS

## 2016-01-04 MED ORDER — SOD CITRATE-CITRIC ACID 500-334 MG/5ML PO SOLN
ORAL | Status: AC
  Administered 2016-01-04: 30 mL
  Filled 2016-01-04: qty 15

## 2016-01-04 MED ORDER — SIMETHICONE 80 MG PO CHEW
80.0000 mg | CHEWABLE_TABLET | ORAL | Status: DC | PRN
  Administered 2016-01-05: 80 mg via ORAL

## 2016-01-04 MED ORDER — FUROSEMIDE 10 MG/ML IJ SOLN
INTRAMUSCULAR | Status: DC | PRN
  Administered 2016-01-04: 20 mg via INTRAMUSCULAR

## 2016-01-04 MED ORDER — MAGNESIUM SULFATE 50 % IJ SOLN
2.0000 g/h | INTRAVENOUS | Status: DC
  Administered 2016-01-05: 2 g/h via INTRAVENOUS
  Filled 2016-01-04 (×2): qty 80

## 2016-01-04 MED ORDER — WITCH HAZEL-GLYCERIN EX PADS: 1.0000 "application " | MEDICATED_PAD | CUTANEOUS | Status: DC | PRN

## 2016-01-04 MED ORDER — ENOXAPARIN SODIUM 60 MG/0.6ML ~~LOC~~ SOLN
60.0000 mg | SUBCUTANEOUS | Status: DC
  Administered 2016-01-05 – 2016-01-07 (×3): 60 mg via SUBCUTANEOUS
  Filled 2016-01-04 (×4): qty 0.6

## 2016-01-04 MED ORDER — BUPIVACAINE HCL (PF) 0.25 % IJ SOLN
INTRAMUSCULAR | Status: AC
  Filled 2016-01-04: qty 30

## 2016-01-04 MED ORDER — ZOLPIDEM TARTRATE 5 MG PO TABS
5.0000 mg | ORAL_TABLET | Freq: Every evening | ORAL | Status: DC | PRN
  Administered 2016-01-06: 5 mg via ORAL
  Filled 2016-01-04: qty 1

## 2016-01-04 MED ORDER — KETAMINE HCL 10 MG/ML IJ SOLN
INTRAMUSCULAR | Status: AC
  Filled 2016-01-04: qty 1

## 2016-01-04 MED ORDER — FENTANYL CITRATE (PF) 100 MCG/2ML IJ SOLN
INTRAMUSCULAR | Status: DC | PRN
  Administered 2016-01-04: 10 ug via INTRATHECAL
  Administered 2016-01-04: 90 ug via INTRAVENOUS

## 2016-01-04 MED ORDER — METHYLERGONOVINE MALEATE 0.2 MG PO TABS: 0.2000 mg | ORAL_TABLET | ORAL | Status: DC | PRN

## 2016-01-04 MED ORDER — ONDANSETRON HCL 4 MG/2ML IJ SOLN: 4.0000 mg | Freq: Three times a day (TID) | INTRAMUSCULAR | Status: DC | PRN

## 2016-01-04 MED ORDER — SCOPOLAMINE 1 MG/3DAYS TD PT72: 1.0000 | MEDICATED_PATCH | Freq: Once | TRANSDERMAL | Status: DC

## 2016-01-04 MED ORDER — KETOROLAC TROMETHAMINE 30 MG/ML IJ SOLN: 30.0000 mg | Freq: Four times a day (QID) | INTRAMUSCULAR | Status: AC | PRN

## 2016-01-04 MED ORDER — PRENATAL MULTIVITAMIN CH
1.0000 | ORAL_TABLET | Freq: Every day | ORAL | Status: DC
Start: 2016-01-05 — End: 2016-01-07
  Administered 2016-01-05 – 2016-01-06 (×2): 1 via ORAL
  Filled 2016-01-04 (×2): qty 1

## 2016-01-04 MED ORDER — NALOXONE HCL 0.4 MG/ML IJ SOLN: 0.4000 mg | INTRAMUSCULAR | Status: DC | PRN

## 2016-01-04 MED ORDER — PHENYLEPHRINE 8 MG IN D5W 100 ML (0.08MG/ML) PREMIX OPTIME
INJECTION | INTRAVENOUS | Status: DC | PRN
  Administered 2016-01-04: 40 ug/min via INTRAVENOUS

## 2016-01-04 MED ORDER — MORPHINE SULFATE (PF) 0.5 MG/ML IJ SOLN
INTRAMUSCULAR | Status: DC | PRN
  Administered 2016-01-04: 2 mg via INTRAVENOUS
  Administered 2016-01-04: 2 mg via EPIDURAL
  Administered 2016-01-04: .2 mg via INTRATHECAL

## 2016-01-04 MED ORDER — IBUPROFEN 600 MG PO TABS
600.0000 mg | ORAL_TABLET | Freq: Four times a day (QID) | ORAL | Status: DC
  Administered 2016-01-04 – 2016-01-07 (×11): 600 mg via ORAL
  Filled 2016-01-04 (×11): qty 1

## 2016-01-04 MED ORDER — OXYTOCIN 40 UNITS IN LACTATED RINGERS INFUSION - SIMPLE MED: 2.5000 [IU]/h | INTRAVENOUS | Status: AC

## 2016-01-04 MED ORDER — SCOPOLAMINE 1 MG/3DAYS TD PT72
MEDICATED_PATCH | TRANSDERMAL | Status: DC | PRN
  Administered 2016-01-04: 1 via TRANSDERMAL

## 2016-01-04 MED ORDER — FENTANYL CITRATE (PF) 100 MCG/2ML IJ SOLN
INTRAMUSCULAR | Status: AC
  Filled 2016-01-04: qty 2

## 2016-01-04 MED ORDER — HYDROMORPHONE HCL 1 MG/ML IJ SOLN
0.2500 mg | INTRAMUSCULAR | Status: DC | PRN
  Administered 2016-01-04 (×2): 0.25 mg via INTRAVENOUS
  Administered 2016-01-04: 0.5 mg via INTRAVENOUS

## 2016-01-04 MED ORDER — SIMETHICONE 80 MG PO CHEW
80.0000 mg | CHEWABLE_TABLET | Freq: Three times a day (TID) | ORAL | Status: DC
  Administered 2016-01-05 – 2016-01-07 (×8): 80 mg via ORAL
  Filled 2016-01-04 (×8): qty 1

## 2016-01-04 MED ORDER — ACETAMINOPHEN 500 MG PO TABS
1000.0000 mg | ORAL_TABLET | Freq: Four times a day (QID) | ORAL | Status: AC
  Administered 2016-01-04 – 2016-01-05 (×4): 1000 mg via ORAL
  Filled 2016-01-04 (×4): qty 2

## 2016-01-04 MED ORDER — KETOROLAC TROMETHAMINE 30 MG/ML IJ SOLN: 30.0000 mg | Freq: Once | INTRAMUSCULAR | Status: DC

## 2016-01-04 MED ORDER — DEXTROSE 5 % IV SOLN
3.0000 g | Freq: Once | INTRAVENOUS | Status: AC
  Administered 2016-01-04: 3 g via INTRAVENOUS
  Filled 2016-01-04: qty 3000

## 2016-01-04 MED ORDER — DIPHENHYDRAMINE HCL 25 MG PO CAPS
25.0000 mg | ORAL_CAPSULE | Freq: Four times a day (QID) | ORAL | Status: DC | PRN
  Filled 2016-01-04: qty 1

## 2016-01-04 MED ORDER — NIFEDIPINE ER 30 MG PO TB24
30.0000 mg | ORAL_TABLET | Freq: Every day | ORAL | Status: DC
  Administered 2016-01-04: 30 mg via ORAL
  Filled 2016-01-04 (×2): qty 1

## 2016-01-04 MED ORDER — DEXTROSE 5 % IV SOLN: 1.0000 ug/kg/h | INTRAVENOUS | Status: DC | PRN

## 2016-01-04 MED ORDER — DIBUCAINE 1 % RE OINT: 1.0000 "application " | TOPICAL_OINTMENT | RECTAL | Status: DC | PRN

## 2016-01-04 MED ORDER — DIPHENHYDRAMINE HCL 50 MG/ML IJ SOLN: 12.5000 mg | INTRAMUSCULAR | Status: DC | PRN

## 2016-01-04 MED ORDER — HYDROXYZINE HCL 50 MG PO TABS
50.0000 mg | ORAL_TABLET | Freq: Three times a day (TID) | ORAL | Status: DC | PRN
  Administered 2016-01-04: 50 mg via ORAL

## 2016-01-04 MED ORDER — DIPHENHYDRAMINE HCL 25 MG PO CAPS
25.0000 mg | ORAL_CAPSULE | ORAL | Status: DC | PRN
  Administered 2016-01-05: 25 mg via ORAL

## 2016-01-04 MED ORDER — BUPIVACAINE IN DEXTROSE 0.75-8.25 % IT SOLN
INTRATHECAL | Status: DC | PRN
  Administered 2016-01-04: 10.5 mg via INTRATHECAL

## 2016-01-04 MED ORDER — OXYCODONE-ACETAMINOPHEN 5-325 MG PO TABS
2.0000 | ORAL_TABLET | ORAL | Status: DC | PRN
Start: 2016-01-04 — End: 2016-01-07
  Administered 2016-01-07: 2 via ORAL
  Filled 2016-01-04: qty 2

## 2016-01-04 MED ORDER — SIMETHICONE 80 MG PO CHEW
80.0000 mg | CHEWABLE_TABLET | ORAL | Status: DC
Start: 2016-01-05 — End: 2016-01-07
  Administered 2016-01-05 – 2016-01-06 (×2): 80 mg via ORAL
  Filled 2016-01-04 (×3): qty 1

## 2016-01-04 MED ORDER — ENOXAPARIN SODIUM 40 MG/0.4ML ~~LOC~~ SOLN
40.0000 mg | SUBCUTANEOUS | Status: DC
  Filled 2016-01-04: qty 0.4

## 2016-01-04 MED ORDER — MEASLES, MUMPS & RUBELLA VAC ~~LOC~~ INJ: 0.5000 mL | INJECTION | Freq: Once | SUBCUTANEOUS | Status: DC

## 2016-01-04 MED ORDER — MAGNESIUM SULFATE 50 % IJ SOLN
2.0000 g/h | INTRAVENOUS | Status: DC
  Administered 2016-01-04: 2 g/h via INTRAVENOUS
  Filled 2016-01-04: qty 80

## 2016-01-04 MED ORDER — METHYLERGONOVINE MALEATE 0.2 MG/ML IJ SOLN: 0.2000 mg | INTRAMUSCULAR | Status: DC | PRN

## 2016-01-04 MED ORDER — OXYCODONE-ACETAMINOPHEN 5-325 MG PO TABS
1.0000 | ORAL_TABLET | ORAL | Status: DC | PRN
  Administered 2016-01-06 (×4): 1 via ORAL
  Filled 2016-01-04 (×5): qty 1

## 2016-01-04 MED ORDER — HYDROXYZINE HCL 50 MG PO TABS
50.0000 mg | ORAL_TABLET | Freq: Three times a day (TID) | ORAL | Status: DC | PRN
  Filled 2016-01-04 (×2): qty 1

## 2016-01-04 MED ORDER — ONDANSETRON HCL 4 MG/2ML IJ SOLN
4.0000 mg | Freq: Once | INTRAMUSCULAR | Status: DC | PRN
Start: 2016-01-04 — End: 2016-01-04

## 2016-01-04 MED ORDER — HYDROMORPHONE HCL 1 MG/ML IJ SOLN
INTRAMUSCULAR | Status: AC
  Administered 2016-01-04: 0.25 mg via INTRAVENOUS
  Filled 2016-01-04: qty 1

## 2016-01-04 SURGICAL SUPPLY — 39 items
BENZOIN TINCTURE PRP APPL 2/3 (GAUZE/BANDAGES/DRESSINGS) ×3 IMPLANT
BOOTIES KNEE HIGH SLOAN (MISCELLANEOUS) ×6 IMPLANT
CLAMP CORD UMBIL (MISCELLANEOUS) IMPLANT
CLOSURE WOUND 1/2 X4 (GAUZE/BANDAGES/DRESSINGS) ×1
CLOTH BEACON ORANGE TIMEOUT ST (SAFETY) ×3 IMPLANT
DECANTER SPIKE VIAL GLASS SM (MISCELLANEOUS) ×3 IMPLANT
DRAIN JACKSON PRT FLT 10 (DRAIN) IMPLANT
DRAIN JACKSON PRT FLT 7MM (DRAIN) ×3 IMPLANT
DRSG OPSITE POSTOP 4X10 (GAUZE/BANDAGES/DRESSINGS) ×3 IMPLANT
DURAPREP 26ML APPLICATOR (WOUND CARE) ×3 IMPLANT
ELECT REM PT RETURN 9FT ADLT (ELECTROSURGICAL) ×3
ELECTRODE REM PT RTRN 9FT ADLT (ELECTROSURGICAL) ×1 IMPLANT
EVACUATOR SILICONE 100CC (DRAIN) ×3 IMPLANT
EXTRACTOR VACUUM M CUP 4 TUBE (SUCTIONS) IMPLANT
EXTRACTOR VACUUM M CUP 4' TUBE (SUCTIONS)
GLOVE BIOGEL PI IND STRL 7.0 (GLOVE) ×2 IMPLANT
GLOVE BIOGEL PI INDICATOR 7.0 (GLOVE) ×4
GLOVE ECLIPSE 6.5 STRL STRAW (GLOVE) ×3 IMPLANT
GOWN STRL REUS W/TWL LRG LVL3 (GOWN DISPOSABLE) ×6 IMPLANT
KIT ABG SYR 3ML LUER SLIP (SYRINGE) IMPLANT
NEEDLE HYPO 22GX1.5 SAFETY (NEEDLE) ×3 IMPLANT
NEEDLE HYPO 25X5/8 SAFETYGLIDE (NEEDLE) IMPLANT
NS IRRIG 1000ML POUR BTL (IV SOLUTION) ×6 IMPLANT
PACK C SECTION WH (CUSTOM PROCEDURE TRAY) ×3 IMPLANT
PAD OB MATERNITY 4.3X12.25 (PERSONAL CARE ITEMS) ×3 IMPLANT
PENCIL SMOKE EVAC W/HOLSTER (ELECTROSURGICAL) ×3 IMPLANT
RTRCTR C-SECT PINK 25CM LRG (MISCELLANEOUS) ×3 IMPLANT
STAPLER VISISTAT 35W (STAPLE) ×3 IMPLANT
STRIP CLOSURE SKIN 1/2X4 (GAUZE/BANDAGES/DRESSINGS) ×2 IMPLANT
SUT MNCRL AB 3-0 PS2 27 (SUTURE) ×3 IMPLANT
SUT SILK 2 0 FSL 18 (SUTURE) IMPLANT
SUT VIC AB 0 CTX 36 (SUTURE) ×8
SUT VIC AB 0 CTX36XBRD ANBCTRL (SUTURE) ×4 IMPLANT
SUT VIC AB 1 CT1 36 (SUTURE) ×6 IMPLANT
SUT VIC AB 2-0 CT1 27 (SUTURE)
SUT VIC AB 2-0 CT1 TAPERPNT 27 (SUTURE) IMPLANT
SYR 20CC LL (SYRINGE) ×3 IMPLANT
TOWEL OR 17X24 6PK STRL BLUE (TOWEL DISPOSABLE) ×3 IMPLANT
TRAY FOLEY CATH SILVER 14FR (SET/KITS/TRAYS/PACK) ×3 IMPLANT

## 2016-01-04 NOTE — Progress Notes (Signed)
Patient complaining of shortness of breath.

## 2016-01-04 NOTE — Progress Notes (Signed)
30 y.o. year old female,at 6381w3d gestation.  SUBJECTIVE:  The patient feels better now that the magnesium has been discontinued. She continues to have a vague headache. The headaches improve with her medication. She says it is the same as before (7 out of 10). She denies right upper quadrant tenderness.  OBJECTIVE:  BP 147/84 mmHg  Pulse 70  Temp(Src) 98.3 F (36.8 C) (Oral)  Resp 18  Ht 5\' 6"  (1.676 m)  Wt 255 lb 6.4 oz (115.849 kg)  BMI 41.24 kg/m2  SpO2 99%  LMP 05/21/2015 (LMP Unknown)  Fetal Heart Tones:  Category 1  Contractions:          None  HEENT: Within normal limits Chest: Clear. No E to A changes Heart: Regular rate and rhythm Abdomen: Gravid, nontender Extremities: Edema noted (1+) Reflexes: Normal  Results for orders placed or performed during the hospital encounter of 01/01/16 (from the past 24 hour(s))  Glucose, capillary     Status: None   Collection Time: 01/03/16 12:52 PM  Result Value Ref Range   Glucose-Capillary 97 65 - 99 mg/dL   Comment 1 Document in Chart    Comment 2 Repeat Test   Glucose, capillary     Status: Abnormal   Collection Time: 01/03/16  4:04 PM  Result Value Ref Range   Glucose-Capillary 118 (H) 65 - 99 mg/dL   Comment 1 Notify RN    Comment 2 Document in Chart   Glucose, capillary     Status: Abnormal   Collection Time: 01/03/16 10:30 PM  Result Value Ref Range   Glucose-Capillary 107 (H) 65 - 99 mg/dL   CBC    Component Value Date/Time   WBC 13.8* 01/03/2016 0537   RBC 4.06 01/03/2016 0537   HGB 9.7* 01/03/2016 0537   HCT 31.1* 01/03/2016 0537   PLT 263 01/03/2016 0537   MCV 76.6* 01/03/2016 0537   MCH 23.9* 01/03/2016 0537   MCHC 31.2 01/03/2016 0537   RDW 14.1 01/03/2016 0537   LYMPHSABS 2.2 01/03/2016 0537   MONOABS 0.4 01/03/2016 0537   EOSABS 0.0 01/03/2016 0537   BASOSABS 0.0 01/03/2016 0537    ASSESSMENT:  5281w3d Weeks Pregnancy  Preeclampsia  History of preeclampsia  History of 2 prior cesarean  sections  Gestational diabetes  Obesity  PLAN:  Monitor blood pressures and symptoms carefully. We will follow the recommendations of the MFM service as outlined below:  Recommendations:  1) Would begin Magnesium sulfate now and continue at least until steroid complete (tomorrow morning)(DC'ed at 12:05 a.m. today) 2) Reevaluate the patient at that time - if the patient continues to have severe headaches that do not improve with medications and continues to have severe range blood pressures, would recommend moving toward delivery 3) If clinically improved / stable - discontinue magnesium sulfate tomorrow and resume expectant management 4) Recommend preeclampsia labs tomorrow and 2-3x weekly thereafter 5) At least daily fetal tracings / NSTs while hospitalized 6) If clinically stable, may consider adding antihypertensive medication if needed - my preference would be Procardia XL 30 mg and titrate as appropriate to maintain blood pressures in the 140/90 range. Max dose 120 mg XL daily 7) Would move toward delivery for: persistent severe range blood pressures despite medications, LFTs > 2x normal, thrombocytopenia (plts < 100k), Creat > 1.2 mg/dl, severe range headaches or non reassuring fetal testing 8) If otherwise stable - delivery at 34 weeks.  Leonard SchwartzArthur Vernon Ryelle Ruvalcaba, M.D. 01/04/2016 8:08 AM

## 2016-01-04 NOTE — Transfer of Care (Signed)
Immediate Anesthesia Transfer of Care Note  Patient: Stephanie [name]  Procedure(s) Performed: Procedure(s): CESAREAN SECTION (N/A)  Patient Location: PACU  Anesthesia Type:Spinal  Level of Consciousness: awake, alert , oriented and patient cooperative  Airway & Oxygen Therapy: Patient Spontanous Breathing and Patient connected to face mask oxygen  Post-op Assessment: Report given to RN and Post -op Vital signs reviewed and stable  Post vital signs: Reviewed and stable  Last Vitals:  Filed Vitals:   01/04/16 1555 01/04/16 1605  BP:    Pulse: 66 67  Temp:  36.7 C  Resp:  20    Last Pain:  Filed Vitals:   01/04/16 1814  PainSc: 7       Patients Stated Pain Goal: 3 (01/03/16 2004)  Complications: No apparent anesthesia complications

## 2016-01-04 NOTE — Anesthesia Procedure Notes (Signed)
Spinal Patient location during procedure: OR Start time: 01/04/2016 4:20 PM End time: 01/04/2016 4:25 PM Staffing Anesthesiologist: Leilani AbleHATCHETT, Kiamesha Samet Performed by: anesthesiologist  Preanesthetic Checklist Completed: patient identified, surgical consent, pre-op evaluation, timeout performed, IV checked, risks and benefits discussed and monitors and equipment checked Spinal Block Patient position: sitting Prep: site prepped and draped and DuraPrep Patient monitoring: heart rate, cardiac monitor, continuous pulse ox and blood pressure Approach: midline Location: L3-4 Injection technique: single-shot Needle Needle type: Sprotte  Needle gauge: 24 G Needle length: 9 cm Needle insertion depth: 7 cm Assessment Sensory level: T4

## 2016-01-04 NOTE — Progress Notes (Signed)
I received a referral from pt's nurse, [name]   Stephanie ModyKrystin was feeling scared and alone as she faced potential delivery of her baby.  Her SO is currently serving a short time in jail and they were expecting him to be released on Monday.  She has a history of being alone for an emergency C-Section for pulmonary edema with her first baby.  Her difficulty breathing today brought up trauma from that experience.    We were [facility], and Stephanie [name] was able to reach out to her SO, Stephanie [name], to allow him to call her today.  We asked about the possibility of him skyping in for the procedure, but Stephanie [name] did not have the technology available to do that.    While Stephanie ModyKrystin was on the phone with Stephanie [name], he was able to talk with a nurse, Stephanie [name], so that he could better understand the medical situation and how it differed from her experience with pulmonary edema that caused her to be in the ICU for a month following the delivery of her first child.  We also worked out a plan with Stephanie [name] that we would call and leave a message with him for [name] after delivery.    In the meantime, Stephanie [name]'s father and his SO arrived and are able to be a supportive presence for her as she prepares for her baby's birth.  Please page [name] if anything arises over the weekend, [phone].  Also, Stephanie [name] is reachable at [phone].  Chaplain Dyanne CarrelKaty Floretta Petro, Bcc Pager, [phone] 3:29 PM     01/04/16 1500  Clinical Encounter Type  Visited With Patient  Visit Type Spiritual support;Social support  Consult/Referral To Nurse  Spiritual Encounters  Spiritual Needs Emotional

## 2016-01-04 NOTE — Anesthesia Postprocedure Evaluation (Signed)
Anesthesia Post Note  Patient: Stephanie [name]  Procedure(s) Performed: Procedure(s) (LRB): CESAREAN SECTION (N/A)  Patient location during evaluation: A-ICU Anesthesia Type: Spinal Level of consciousness: awake, awake and alert, oriented and patient cooperative Pain management: pain level controlled Vital Signs Assessment: post-procedure vital signs reviewed and stable Respiratory status: spontaneous breathing, nonlabored ventilation, respiratory function stable and patient connected to nasal cannula oxygen Cardiovascular status: stable Postop Assessment: no headache, no backache, patient able to bend at knees and no signs of nausea or vomiting Anesthetic complications: no     Last Vitals:  Filed Vitals:   01/04/16 2100 01/04/16 2200  BP: 142/79 153/93  Pulse: 85 89  Temp:    Resp:  18    Last Pain:  Filed Vitals:   01/04/16 2208  PainSc: 6    Pain Goal: Patients Stated Pain Goal: 3 (01/04/16 2113)               Morganne Haile L

## 2016-01-04 NOTE — Progress Notes (Addendum)
Dr. Estanislado Pandyivard updated on patient status.  Filed Vitals:   01/04/16 1055 01/04/16 1100 01/04/16 1105 01/04/16 1110  BP:    143/67  Pulse: 54 58 54 52  Temp:   98.1 F (36.7 C)   TempSrc:   Oral   Resp:   20   Height:      Weight:      SpO2: 93% 93% 94% 95%    Will start Procardia 30 mg XL daily now. Repeat PIH labs and T&S now (current T&S expires tonight).  Has been NPO this am--nothing by mouth since last night.  Last US 01/02/16--vtx presentation, posterior low-lying placenta.  Will re-evaluate patient status around 1pm for BP, HA, and respiratory status.  Nigel BridgemanVicki Latham, CNM 01/04/16 11:30a  Patient seen and still complaining of 7/10 headache despite pain meds, Procardia XL 30 and Vistaril.  Recommend to proceed with cesarean delivery as recommended by MFM   Cesarean section reviewed with pt with R&B including but not limited to:  bleeding, infection, injury to other organs. Low transverse approach planned which will allow vaginal delivery with future pregnancies. Should a vertical incision or inverted T be needed, patient is aware that repeat cesarean sections would be recommended in the future. Expected hospital stay and recovery also discussed.  Patient is tearful. [place] [place] [facility] [place]. [place] and father will be at her side during procedure. Magnesium sulfate restarted at 13:30

## 2016-01-04 NOTE — Progress Notes (Signed)
   01/04/16 1936  Vitals  Temp 98.6 F (37 C)  BP (!) 144/83 mmHg  BP Location Right Arm  BP Method Automatic  Patient Position (if appropriate) Lying  Pulse Rate 69  Pulse Rate Source Monitor  Resp 20  Oxygen Therapy  SpO2 (!) 89 %  O2 Device Room Air  Pt arrived on unit from PACU we attempted to wean 02 from NRB Mask to [place] 02 sat decrease to 88-89% . NRB mask resumed. We will continue to monitor and wean 02 whenever pt is able to maintain 02 sat>94

## 2016-01-04 NOTE — Op Note (Signed)
Preoperative diagnosis: Intrauterine pregnancy at 31 weeks and 3 days, pre eclampsia with severe features, 2 previous cesarean sections  Post operative diagnosis: Same  Anesthesia: Spinal  Anesthesiologist: Dr. Arby BarretteHatchett  Procedure: Repeat low transverse cesarean section  Surgeon: Dr. Dois DavenportSandra Amaliya Whitelaw  Assistant: Henreitta LeberElmira Powell PA-C  Estimated blood loss: 1000 cc  Procedure:  After being informed of the planned procedure and possible complications including bleeding, infection, injury to other organs, informed consent is obtained. The patient is taken to OR #9 given spinal anesthesia without complication. She is placed in the dorsal decubitus position with the pelvis tilted to the left. She is then prepped and draped in Stephanie Stephanie [name]. Stephanie Stephanie [name] is inserted in her bladder.  After assessing adequate level of anesthesia, we infiltrate the suprapubic area with 20 cc of Marcaine 0.25 and perform Stephanie Stephanie [name] which is brought down sharply to the fascia. The fascia is entered in Stephanie Stephanie [name]. Linea alba is dissected. Peritoneum is entered in Stephanie Stephanie [name]. An Alexis retractor is easily positioned. There were no significant adhesions in the pelvis.  The myometrium is then entered in Stephanie Stephanie [name], 2 cm above the vesico-uterine junction ; first with knife and then extended bluntly. Amniotic fluid is clearassist the birth of Stephanie [name] female  [name] in vertex pesentation. Mouth and nose are suctioned. The baby is delivered. The cord is clamped and sectioned. The baby is given to the neonatologist present in the room.  10 cc of blood is drawn from the umbilical vein 4 cc is drawn from the umbilical artery for cord gas.  placenta is allowed to deliver spontaneously. It is complete and the cord has 3 vessels. Uterine revision is negative.  We proceed with closure of the myometrium in 2 layers: First with Stephanie Stephanie [name] of 0 Vicryl, then with Stephanie Stephanie [name]  of 0 Vicryl imbricating the first [name]. Stephanie [name] is completed with cauterization on peritoneal edges. Stephanie [name] also requires multiple figure-of-eight stitches of 0 Vicryl.  Both paracolic gutters are cleaned. Both tubes and ovaries are assessed and normal. The pelvis is profusely irrigated with warm saline to confirm Stephanie Stephanie [name].  Retractors and sponges are removed. Under fascia Stephanie [name] is completed with cauterization. The fascia is then closed with 2 running sutures of 0 Vicryl meeting midline. The wound is irrigated with warm saline and Stephanie [name] is completed with cauterization. Stephanie Stephanie [name] is positioned in the subcuticular layer and sutured to the skin with 0 Silk. The subcuticular layer is closed with interrupted sutures of 2-0 Plain.  The skin is closed with staples due to the patient's deteriorating status with sudden decrease in O2 saturation. She was given IV lasix 20 mg and Stephanie Stephanie [name].   Instrument and sponge count is complete x2. Estimated blood loss is 1000 cc.  The  patient  is taken to recovery room in Stephanie Stephanie [name].  female baby named Stephanie Stephanie [name] was born at 16:59 weighed 3 lbs 8 oz and received an APGAR of 8 and 9.Cord pH is 7.283   Specimen: Placenta sent to pathology   Stephanie Stephanie [name],[name] MD 6/9/20174:16 PM

## 2016-01-04 NOTE — Progress Notes (Signed)
Continue to attempt IV access. Nursing x 2 attempts. Anethesia now in room attempting access.

## 2016-01-04 NOTE — Anesthesia Preprocedure Evaluation (Signed)
Anesthesia Evaluation  Patient identified by MRN, date of birth, ID band Patient awake    Reviewed: Allergy & Precautions, H&P , Patient's Chart, lab work & pertinent test results  Airway Mallampati: II  TM Distance: >3 FB Neck ROM: full    Dental no notable dental hx.    Pulmonary neg pulmonary ROS,    Pulmonary exam normal        Cardiovascular hypertension, Normal cardiovascular exam     Neuro/Psych negative neurological ROS  negative psych ROS   GI/Hepatic negative GI ROS, Neg liver ROS,   Endo/Other  Morbid obesity  Renal/GU negative Renal ROS     Musculoskeletal   Abdominal (+) + obese,   Peds  Hematology negative hematology ROS (+)   Anesthesia Other Findings   Reproductive/Obstetrics (+) Pregnancy                             Anesthesia Physical Anesthesia Plan  ASA: III  Anesthesia Plan: Spinal   Post-op Pain Management:    Induction:   Airway Management Planned:   Additional Equipment:   Intra-op Plan:   Post-operative Plan:   Informed Consent: I have reviewed the patients History and Physical, chart, labs and discussed the procedure including the risks, benefits and alternatives for the proposed anesthesia with the patient or authorized representative who has indicated his/her understanding and acceptance.     Plan Discussed with: CRNA and Surgeon  Anesthesia Plan Comments:         Anesthesia Quick Evaluation

## 2016-01-04 NOTE — Progress Notes (Addendum)
Called by RN due to patient's complaints of HA and SOB, chest heaviness.  Filed Vitals:   01/03/16 2300 01/04/16 0000 01/04/16 0821 01/04/16 0927  BP:  147/84 158/97 152/84  Pulse:  70 57 52  Temp:  98.3 F (36.8 C) 98.5 F (36.9 C)   TempSrc:  Oral Oral   Resp: 16 18 20 22   Height:      Weight:      SpO2:    92%   Lungs clear. Ext WNL, negative Homan's.  Results for orders placed or performed during the hospital encounter of 01/01/16 (from the past 24 hour(s))  Glucose, capillary     Status: None   Collection Time: 01/03/16 12:52 PM  Result Value Ref Range   Glucose-Capillary 97 65 - 99 mg/dL   Comment 1 Document in Chart    Comment 2 Repeat Test   Glucose, capillary     Status: Abnormal   Collection Time: 01/03/16  4:04 PM  Result Value Ref Range   Glucose-Capillary 118 (H) 65 - 99 mg/dL   Comment 1 Notify RN    Comment 2 Document in Chart   Glucose, capillary     Status: Abnormal   Collection Time: 01/03/16 10:30 PM  Result Value Ref Range   Glucose-Capillary 107 (H) 65 - 99 mg/dL  Glucose, capillary     Status: None   Collection Time: 01/04/16  8:27 AM  Result Value Ref Range   Glucose-Capillary 94 65 - 99 mg/dL    Consulted with Dr. Stefano GaulStringer.  Plan: Give po Percocet now for HA. CXR NPO except for sip with meds.  Nigel BridgemanVicki Avagrace Botelho, CNM 01/04/16 [id number]  Addendum: Returned from CXR: FINDINGS: There is slight lateral right base atelectasis. Lungs are otherwise clear. Heart size and pulmonary vascularity are normal. No adenopathy. No bone lesions.  IMPRESSION: No edema or consolidation. Slight right base atelectasis laterally.  Patient remains anxious and concerned about status. [place] [facility] [place] [facility]--release planned on Monday.  Chaplain in to see patient--she will see if there are any options for early release for FOB.  Filed Vitals:   01/04/16 0940 01/04/16 0945 01/04/16 1005 01/04/16 1010  BP:      Pulse: 61 64 73  56  Temp:      TempSrc:      Resp:      Height:      Weight:      SpO2: 93% 86% 90% 94%   Output 400 cc since 7am.  FHR Category 1, no UCs.  Dr. Stefano GaulStringer updated on patient status and CXR results. He will update Dr. Estanislado Pandyivard at 11am.  Will continue close observation of patient status. Continuous EFM at present. Incentive spirometer to patient. Continue NPO at present.  Nigel BridgemanVicki Graclynn Vanantwerp, CNM 01/04/16 10:47a

## 2016-01-05 ENCOUNTER — Encounter (HOSPITAL_COMMUNITY): Payer: Self-pay | Admitting: Obstetrics and Gynecology

## 2016-01-05 DIAGNOSIS — Z98891 History of uterine scar from previous surgery: Secondary | ICD-10-CM

## 2016-01-05 LAB — COMPREHENSIVE METABOLIC PANEL
ALT: 19 U/L (ref 14–54)
ANION GAP: 7 (ref 5–15)
AST: 30 U/L (ref 15–41)
Albumin: 2.2 g/dL — ABNORMAL LOW (ref 3.5–5.0)
Alkaline Phosphatase: 60 U/L (ref 38–126)
BILIRUBIN TOTAL: 0.5 mg/dL (ref 0.3–1.2)
BUN: 12 mg/dL (ref 6–20)
CHLORIDE: 103 mmol/L (ref 101–111)
CO2: 26 mmol/L (ref 22–32)
Calcium: 7.4 mg/dL — ABNORMAL LOW (ref 8.9–10.3)
Creatinine, Ser: 0.7 mg/dL (ref 0.44–1.00)
Glucose, Bld: 94 mg/dL (ref 65–99)
POTASSIUM: 3.4 mmol/L — AB (ref 3.5–5.1)
SODIUM: 136 mmol/L (ref 135–145)
TOTAL PROTEIN: 5.7 g/dL — AB (ref 6.5–8.1)

## 2016-01-05 LAB — URIC ACID: URIC ACID, SERUM: 6.4 mg/dL (ref 2.3–6.6)

## 2016-01-05 LAB — CBC
HCT: 28.7 % — ABNORMAL LOW (ref 36.0–46.0)
HEMOGLOBIN: 9.1 g/dL — AB (ref 12.0–15.0)
MCH: 24.5 pg — ABNORMAL LOW (ref 26.0–34.0)
MCHC: 31.7 g/dL (ref 30.0–36.0)
MCV: 77.2 fL — ABNORMAL LOW (ref 78.0–100.0)
PLATELETS: 244 10*3/uL (ref 150–400)
RBC: 3.72 MIL/uL — AB (ref 3.87–5.11)
RDW: 14.3 % (ref 11.5–15.5)
WBC: 12.2 10*3/uL — AB (ref 4.0–10.5)

## 2016-01-05 LAB — RPR: RPR Ser Ql: NONREACTIVE

## 2016-01-05 MED ORDER — OXYCODONE HCL 5 MG PO TABS
5.0000 mg | ORAL_TABLET | ORAL | Status: DC | PRN
  Administered 2016-01-05 (×3): 5 mg via ORAL
  Filled 2016-01-05 (×3): qty 1

## 2016-01-05 MED ORDER — LACTATED RINGERS IV SOLN: INTRAVENOUS | Status: DC

## 2016-01-05 MED ORDER — FUROSEMIDE 10 MG/ML IJ SOLN
20.0000 mg | Freq: Once | INTRAMUSCULAR | Status: AC
  Administered 2016-01-05: 20 mg via INTRAVENOUS
  Filled 2016-01-05: qty 2

## 2016-01-05 NOTE — Progress Notes (Signed)
Chaplain visit is a follow-up of care. Ms Stephanie [name] has delivered and her child is in the NICU. Her [facility] on Monday, 12 June. Chaplain Claussen [facility] for a call to be made by the SO to Ms Stephanie [name]. She was greatly pleased and he has been allowed to call her on her cell phone for updates. Current spiritual and emotional care is focused on estrangement from her family, her SO being in jail and not being able to be with her, and the pre-maturity of her child placed in the NICU. Chaplain follow-ups to ensure spiritual care is advised.  Chares. D. Taquita Demby, DMin, MDiv, MA Chaplain

## 2016-01-05 NOTE — Progress Notes (Signed)
Lasix 20 mg IV administered as ordered. We will call in urine output to MD in two hours.

## 2016-01-05 NOTE — Lactation Note (Signed)
This note was copied from a baby's chart. Lactation Consultation Note  Initial visit made.  Breastfeeding consultation services information and Providing Breastmilk For Your Baby In NICU given to patient.  Mom has initiated pumping.  She states she only desires to give baby colostrum.  Reviewed pumping frequency, hand expression and milk collection.  Encouraged to call for assist/concerns prn.  Patient Name: Stephanie [name] ZOXWR'UToday's Date: 01/05/2016 Reason for consult: Initial assessment;NICU baby   Maternal Data    Feeding    LATCH Score/Interventions                      Lactation Tools Discussed/Used Initiated by:: RN Date initiated:: 01/05/16   Consult Status Consult Status: Follow-up Date: 01/06/16 Follow-up type: In-patient    Huston FoleyMOULDEN, Lynnlee Revels S 01/05/2016, 3:49 PM

## 2016-01-05 NOTE — Progress Notes (Addendum)
Stephanie [name] [id number]  Subjective: Postpartum Day 1: Repeat LTC/S due to previous C/S x 2, pre-eclampsia with severe features. Feeling much better today--reports no HA last night, mild today (4/10), no visual sx, or epigastric pain. Denies SOB, chest pain, or pressure. [facility]--did well.  Denies syncope or dizziness. Feeding:  Planning to pump for breastmilk Contraceptive plan:  Undecided  Baby stable in NICU. Patient's husband being released from local incarceration on Monday.  She was able to speak with him by phone last night.  Objective: Temp:  [97.5 F (36.4 C)-99.3 F (37.4 C)] 97.7 F (36.5 C) (06/10 0845) Pulse Rate:  [47-89] 71 (06/10 1000) Resp:  [11-29] 23 (06/10 0957) BP: (124-171)/(45-97) 157/97 mmHg (06/10 1000) SpO2:  [88 %-100 %] 95 % (06/10 1000) Weight:  [107.531 kg (237 lb 1 oz)] 107.531 kg (237 lb 1 oz) (06/09 2000)   Filed Vitals:   01/05/16 0957 01/05/16 1000 01/05/16 1047 01/05/16 1049  BP:  157/97 163/93 151/94  Pulse: 62 71 89 88  Temp:      TempSrc:      Resp: 23     Height:      Weight:      SpO2: 95% 95% 94% 94%   Range this shift:  129-163/83-97.  CBC Latest Ref Rng 01/05/2016 01/04/2016 01/03/2016  WBC 4.0 - 10.5 K/uL 12.2(H) 12.8(H) 13.8(H)  Hemoglobin 12.0 - 15.0 g/dL 9.5(A9.1(L) 2.1(H9.6(L) 0.8(M9.7(L)  Hematocrit 36.0 - 46.0 % 28.7(L) 29.9(L) 31.1(L)  Platelets 150 - 400 K/uL 244 227 263   CMP Latest Ref Rng 01/05/2016 01/04/2016 01/03/2016  Glucose 65 - 99 mg/dL 94 80 578(I122(H)  BUN 6 - 20 mg/dL 12 13 8   Creatinine 0.44 - 1.00 mg/dL 6.960.70 2.950.56 2.840.46  Sodium 135 - 145 mmol/L 136 138 135  Potassium 3.5 - 5.1 mmol/L 3.4(L) 3.3(L) 3.6  Chloride 101 - 111 mmol/L 103 107 107  CO2 22 - 32 mmol/L 26 23 19(L)  Calcium 8.9 - 10.3 mg/dL 7.4(L) 8.1(L) 7.6(L)  Total Protein 6.5 - 8.1 g/dL 1.3(K5.7(L) 4.4(W5.5(L) 6.2(L)  Total Bilirubin 0.3 - 1.2 mg/dL 0.5 1.0(U0.2(L) 0.4  Alkaline Phos 38 - 126 U/L 60 60 61  AST 15 - 41 U/L 30 17 16   ALT 14 - 54 U/L 19  12(L) 10(L)   On Lovenox 60 mg SQ--received 1st dose at 0954 today On Fe BID  Able be be weaned off O2, with sats now stable.   Physical Exam:  General: alert, in good spirits Lochia: appropriate Uterine Fundus: firm Abdomen:  + bowel sounds Incision: Honeycomb dressing CDI DVT Evaluation: No evidence of DVT seen on physical exam. Negative Homan's sign. Calf/Ankle edema is present. DTR 1+, no clonus, 1+ edema, SCDs in place JP drain:   40 cc overnight Foley draining clear urine.  Urine output this shift 350 cc.  Assessment/Plan: Status post repeat cesarean delivery (#3) at 31 3/7 weeks, day 1 Pre-eclampsia with severe features--in AICU. GDM Mild anemia NICU infant Stable Continue current care. Continue magnesium for 24 hours post delivery--delivered at 1659 on 01/04/16. Dr. Estanislado Pandyivard will see today. Fe BID. Plan FBS tomorrow.    Nigel BridgemanLATHAM, VICKI MSN, CNM 01/05/2016, 11:05 AM   Seen and agreed Urine output 125-325 per hour but RR is still at 24 per minute despite O2 sat at 96-98% Headache is much better now at 2/10 Chest pain is resolved Lungs: clear Ext: 3+ pitting edema DTR 2/4 no clonus Will maintain Magnesium for now  and reevaluate in a few hours All findings reviewed with patient and questions answered

## 2016-01-06 LAB — GLUCOSE, CAPILLARY: Glucose-Capillary: 76 mg/dL (ref 65–99)

## 2016-01-06 MED ORDER — HYDROCHLOROTHIAZIDE 25 MG PO TABS
25.0000 mg | ORAL_TABLET | Freq: Every day | ORAL | Status: DC
Start: 2016-01-06 — End: 2016-01-07
  Administered 2016-01-06 – 2016-01-07 (×2): 25 mg via ORAL
  Filled 2016-01-06 (×3): qty 1

## 2016-01-06 NOTE — Progress Notes (Signed)
At 2230 hrs patient diuresed  2500 mls of clear yellow urine post lasix  administration. MD was made aware.

## 2016-01-06 NOTE — Progress Notes (Addendum)
Stephanie [name] [id number]  Subjective: Postpartum Day 2: Repeat C/S (#3) at 31 3/7 weeks due to pre-eclampsia with severe features. Patient up ad lib, reports no syncope or dizziness.  Reports HA 1-2/10, denies visual sx, or epigastric pain. Feels edema is significantly improved s/p Lasix during evening shift. Foley removed at 0630, single void since then of 30 cc with BM. Feeding:  Pumping at present, but plans to bottlefeed. Contraceptive plan:  OCPs, then Nexplanon at some point.  Baby stable in NICU--required chest tube last night for pneumothorax, but doing well, remains on vent.  Objective: Temp:  [97.7 F (36.5 C)-99.5 F (37.5 C)] 99.5 F (37.5 C) (06/11 0752) Pulse Rate:  [56-89] 76 (06/11 0753) Resp:  [18-24] 22 (06/11 0752) BP: (127-163)/(64-98) 143/91 mmHg (06/11 0752) SpO2:  [92 %-99 %] 97 % (06/11 0753) Weight:  [117.039 kg (258 lb 0.4 oz)] 117.039 kg (258 lb 0.4 oz) (06/10 1048)  Weight 237 on 01/04/16.  Filed Vitals:   01/06/16 0400 01/06/16 0600 01/06/16 0752 01/06/16 0753  BP: 127/75 141/96 143/91   Pulse: 67 70  76  Temp:  98.7 F (37.1 C) 99.5 F (37.5 C)   TempSrc:  Oral    Resp:  20 22   Height:      Weight:      SpO2: 96% 97%  97%   Range since d/c mag at 2251--127-150/64-98  CBG (last 3)   Recent Labs  01/03/16 2230 01/04/16 0827 01/06/16 0719  GLUCAP 107* 94 76     CBC Latest Ref Rng 01/05/2016 01/04/2016 01/03/2016  WBC 4.0 - 10.5 K/uL 12.2(H) 12.8(H) 13.8(H)  Hemoglobin 12.0 - 15.0 g/dL 9.1(L) 9.6(L) 9.7(L)  Hematocrit 36.0 - 46.0 % 28.7(L) 29.9(L) 31.1(L)  Platelets 150 - 400 K/uL 244 227 263   I/O balance:  +450 Urine output last shift:  3745 cc.  Received Lasix x 1 at 2137 for decreased urine output--2500 cc post Lasix.  Physical Exam:  General: alert Lochia: appropriate Uterine Fundus: firm Abdomen:  + bowel sounds Incision: Honeycomb dressing CDI DVT Evaluation: No evidence of DVT seen on physical exam. Negative Homan's  sign. Calf/Ankle edema is present, 1+ DTR 1+, no clonus JP drain:   25 cc overnight  . enoxaparin (LOVENOX) injection  60 mg Subcutaneous Q24H  . ferrous sulfate  325 mg Oral BID WC  . ibuprofen  600 mg Oral Q6H  . measles, mumps and rubella vaccine  0.5 mL Subcutaneous Once  . prenatal multivitamin  1 tablet Oral Q1200  . scopolamine  1 patch Transdermal Once  . senna-docusate  2 tablet Oral Q24H  . simethicone  80 mg Oral TID PC  . simethicone  80 mg Oral Q24H  . Tdap  0.5 mL Intramuscular Once    Assessment/Plan: Status post cesarean delivery, day 2--repeat c/s (#3) at 31 3/7 weeks for pre-eclampsia with severe features. GDM NICU infant Rubella non-immune Stable Continue current care. Consulted with Dr. Mancel Bale. HCTZ 25 mg po daily. May transfer to regular pp room. Continue to monitor urine output and BP.  May need additional antihypertensive med. Offer MMR vaccine prior to d/c. Daily weights--will need weight today.    Donnel Saxon MSN, CNM 01/06/2016, 8:24 AM

## 2016-01-06 NOTE — Lactation Note (Signed)
This note was copied from a baby's chart. Lactation Consultation Note  Patient Name: Stephanie [name]'FToday's Date: 01/06/2016   Per RN, Mom is no longer interested in providing EBM to her infant.   Lurline HareRichey, Arif Amendola [facility] 01/06/2016, 5:41 PM

## 2016-01-07 MED ORDER — OXYCODONE-ACETAMINOPHEN 5-325 MG PO TABS: 1.0000 | ORAL_TABLET | ORAL | Status: DC | PRN

## 2016-01-07 MED ORDER — HYDROCHLOROTHIAZIDE 25 MG PO TABS: 25.0000 mg | ORAL_TABLET | Freq: Every day | ORAL | Status: DC

## 2016-01-07 MED ORDER — DOCUSATE SODIUM 100 MG PO CAPS: 100.0000 mg | ORAL_CAPSULE | Freq: Two times a day (BID) | ORAL | Status: DC | PRN

## 2016-01-07 MED ORDER — FERROUS SULFATE 325 (65 FE) MG PO TABS: 325.0000 mg | ORAL_TABLET | Freq: Two times a day (BID) | ORAL | Status: DC

## 2016-01-07 MED ORDER — ENOXAPARIN SODIUM 60 MG/0.6ML ~~LOC~~ SOLN: 60.0000 mg | SUBCUTANEOUS | Status: DC

## 2016-01-07 NOTE — Progress Notes (Signed)
Patient discharged to home. Discharge instructions/paperwork reviewed with patient and family. Patient has no questions at this time.  

## 2016-01-07 NOTE — Discharge Summary (Addendum)
Cesarean Section Delivery Discharge Summary  Stephanie [name]  DOB:    Sep 15, 1985 MRN:    [id number] CSN:    [id number]  Date of admission:                  01/01/2016  Date of discharge:                   01/07/2016  Procedures this admission: Repeat Cesarean delivery   Date of Delivery: 01/04/2016  Newborn Data:  Live born female  Birth Weight: 3 lb 8.4 oz (1600 g) APGAR: 8, 9  Circumcision Plan: Inpatient when discharged from the NICU.    History of Present Illness:  Ms. Stephanie [name] is a 30 y.o. female, G3P1201, who presented at 31w1 d weeks gestation with headache and elevated blood pressures. She was found to have preeclampsia with severe features. She was admitted for several days and remained stable, she received betamethasone and also magnesium sulfate for seizure prophylaxis. However on 01/04/2016 patient complained of shortness of breath and unresolving headache and that point delivery was performed by repeat cesarean cesarean delivery as she had a history of 2 prior cesarean sections. Please see the op report for more details. The patient has been followed at  [facility] of [facility]   Her pregnancy has been complicated by:  Patient Active Problem List   Diagnosis Date Noted  . Status post repeat low transverse cesarean section--due to severe pre-eclampsia 01/05/2016  . Pre-eclampsia, severe 01/04/2016  . Hypertension -- suspect preeclampsia 01/03/2016  . Gestational hypertension w/o significant proteinuria in 3rd trimester 01/03/2016  . Hyperthyroidism 01/02/2016  . Hx of preeclampsia, prior pregnancy, currently pregnant 01/02/2016  . Thrombophlebitis leg superficial 11/29/2015  . Placenta previa 11/29/2015  . H/O: C-section 11/29/2015  . H/O migraine (no meds) 11/29/2015  . Obesity (BMI 30-39.9) 11/29/2015  . Rubella non-immune status, antepartum 11/29/2015  . Hypokalemia 11/29/2015  . H/O  gastroesophageal reflux (GERD) 11/29/2015  . Restless leg syndrome 11/29/2015   Hospital Course--Unscheduled Cesarean:  Due to Preeclampsia with severe features, with worsening symptoms and at 31 weeks 3 days, she was consented for cesarean, with Dr. Cletis Media performing a LTCS under spinal anesthesia, with delivery of a viable female, with weight and Apgars as listed below. Infant was in guarded condition and was taken to the NICU.  The patient was taken to recovery in good condition.  Patient planned to bottlefeed.  She received 24 hours off postpartum magnesium sulfate, she also received daily Lovenox postpartum for DVT prophylaxis and for her history of superficial thrombophlebitis.  On post-op day 1, patient was doing well, tolerating a regular diet, with Hgb of 9.1  Throughout her stay, her physical exam was WNL, her incision was CDI, and her vital signs remained stable.  By post-op day 3, she was up ad lib, tolerating a regular diet, with good pain control with po med.  She was deemed to have received the full benefit of her hospital stay, and was discharged home in stable condition.  Contraceptive choice was undecided, possibly nexplanon. Patient is Rubella Non-immune but she declined MMR vaccine postpartum. Fingerstick glucose checks were mostly normal in the postpartum period.   Feeding:  bottle  Contraception:  Nexplanon  Hemoglobin Results:  CBC Latest Ref Rng 01/05/2016 01/04/2016 01/03/2016  WBC 4.0 - 10.5 K/uL 12.2(H) 12.8(H) 13.8(H)  Hemoglobin 12.0 - 15.0 g/dL 9.1(L) 9.6(L) 9.7(L)  Hematocrit 36.0 - 46.0 % 28.7(L)  29.9(L) 31.1(L)  Platelets 150 - 400 K/uL 244 227 263   ABO, Rh: --/--/O POS, O POS (05/03 2356) Antibody: NEG (05/03 2356) Rubella: !Error! Not immune RPR:   NR 09/20/15, 12/12/15 HBsAg:   Negative 09/20/15 HIV:   Negative 09/20/15 GBS:   unknown Sickle cell/Hgb electrophoresis: N/A Pap: Normal 09/20/15 GC: Negative 08/30/15 Chlamydia: Negative  08/30/15 Genetic screenings:AFP positive - recalculated and found to be normal, panorama Low risk 09/27/15 Glucola: 1hr Abnormal, 3 HR GTT - NOT COMPLETED Other: Thyroid panel abnormal in Feb; repeated T66fee, TSH Normal On 12/26/15 Hgb 13.7 at NOB, 12.1 at 28 weeks    Discharge Physical Exam:   General: alert, cooperative and no distress Lochia: appropriate Uterine Fundus: firm Abdomen:  + bowel sounds,  Incision: Soiled honeycomb dressing peeled of. JP drain removed. New honeycomb dressing and gauze pad over JP site placed. Staples in place incision clean dry and intact. DVT Evaluation: No evidence of DVT seen on physical exam. Calf/Ankle edema is present, 3+ bilaterally on both feet.   Intrapartum Procedures: cesarean: low cervical, transverse, Magnesium sulfate Postpartum Procedures: Magnesium sulfate Complications-Operative and Postpartum: Preeclampsia with severe features, Anemia  Discharge Diagnoses: Preeclampsia with severe features, Superficial thrombophlebitis, Gestational diabetes.  Discharge Information:  Activity:           pelvic rest and No heavy lifting Diet:                routine and low sodium diet Medications: PNV, Iron, Percocet and Colace, lovenox, Hydrochlorothiazide.  Condition:      stable Instructions:  Discharge to: home  Follow-up Information    [facility] Schedule an appointment as soon as possible for a visit in 4 days.   Specialty:  Obstetrics and Gynecology   Why:  Staples removal and BP check   Contact information:   [address] [place] [phone]     Follow up with [facility] 6 weeks.   Specialty:  Obstetrics and Gynecology   Why:  Postpartum check   Contact information:   [phone]       KAlinda DoomsCNM 01/07/2016 12:46  PM

## 2016-01-07 NOTE — Progress Notes (Signed)
Stephanie [name].  DOB:  July 14, 1986  MRN: [id number]   Subjective: Postpartum Day 3. Cesarean Delivery Patient reports no complaints. Tolerating regular diet, plus flatus. With scant vaginal bleeding. Abdominal pain well controlled.   Objective: Vital signs in last 24 hours: Temp:  [98 F (36.7 C)-98.7 F (37.1 C)] 98.6 F (37 C) (06/12 0600) Pulse Rate:  [70-83] 70 (06/12 0600) Resp:  [18-20] 18 (06/12 0600) BP: (133-148)/(69-99) 144/99 mmHg (06/12 0600) SpO2:  [96 %-100 %] 97 % (06/12 0600)  Filed Vitals:   01/06/16 1535 01/06/16 1715 01/06/16 2115 01/07/16 0600  BP: 135/87 133/69 144/79 144/99  Pulse: 75 83 79 70  Temp: 98.1 F (36.7 C) 98 F (36.7 C) 98.7 F (37.1 C) 98.6 F (37 C)  TempSrc: Oral Oral Oral   Resp: '20 18 18 18  ' Height:      Weight:      SpO2: 100% 97% 96% 97%   Input/output in past 24 hrs up to 01/07/16'@0700' : 1040cc/2080 cc.  Net: -1052cc/24 hrs. Average urine output in 24 hrs: 86cc/hr.  JP drain 6 mL over 6 hours.    Physical Exam:  General: alert, cooperative and no distress Lochia: appropriate Uterine Fundus: firm Incision: with dressing, clean, dry, intact DVT Evaluation: No evidence of DVT seen on physical exam. Calf/Ankle edema is present.   Recent Labs  01/04/16 1219 01/05/16 0603  HGB 9.6* 9.1*  HCT 29.9* 28.7*     Current facility-administered medications:  .  acetaminophen (TYLENOL) tablet 650 mg, 650 mg, Oral, Q4H PRN, Delsa Bern, MD .  coconut oil, 1 application, Topical, PRN, Delsa Bern, MD .  witch hazel-glycerin (TUCKS) pad 1 application, 1 application, Topical, PRN **AND** dibucaine (NUPERCAINAL) 1 % rectal ointment 1 application, 1 application, Rectal, PRN, Delsa Bern, MD .  diphenhydrAMINE (BENADRYL) capsule 25 mg, 25 mg, Oral, Q6H PRN, Delsa Bern, MD .  diphenhydrAMINE (BENADRYL) injection 12.5 mg, 12.5 mg, Intravenous, Q4H PRN **OR** diphenhydrAMINE (BENADRYL) capsule 25 mg, 25 mg, Oral, Q4H PRN, Lyn Hollingshead,  MD, 25 mg at 01/05/16 0045 .  enoxaparin (LOVENOX) injection 60 mg, 60 mg, Subcutaneous, Q24H, Lavetta Nielsen, CNM, 60 mg at 01/07/16 0755 .  ferrous sulfate tablet 325 mg, 325 mg, Oral, BID WC, Delsa Bern, MD, 325 mg at 01/07/16 0755 .  hydrochlorothiazide (HYDRODIURIL) tablet 25 mg, 25 mg, Oral, Daily, Donnel Saxon, CNM, 25 mg at 01/06/16 1158 .  ibuprofen (ADVIL,MOTRIN) tablet 600 mg, 600 mg, Oral, Q6H, Delsa Bern, MD, 600 mg at 01/07/16 0605 .  lactated ringers infusion, , Intravenous, Continuous, Delsa Bern, MD, Stopped at 01/05/16 2330 .  measles, mumps and rubella vaccine (MMR) injection 0.5 mL, 0.5 mL, Subcutaneous, Once, Delsa Bern, MD .  menthol-cetylpyridinium (CEPACOL) lozenge 3 mg, 1 lozenge, Oral, Q2H PRN, Delsa Bern, MD .  methylergonovine (METHERGINE) tablet 0.2 mg, 0.2 mg, Oral, Q4H PRN **OR** methylergonovine (METHERGINE) injection 0.2 mg, 0.2 mg, Intramuscular, Q4H PRN, Delsa Bern, MD .  nalbuphine (NUBAIN) injection 5 mg, 5 mg, Intravenous, Q4H PRN **OR** nalbuphine (NUBAIN) injection 5 mg, 5 mg, Subcutaneous, Q4H PRN, Lyn Hollingshead, MD .  nalbuphine (NUBAIN) injection 5 mg, 5 mg, Intravenous, Once PRN **OR** nalbuphine (NUBAIN) injection 5 mg, 5 mg, Subcutaneous, Once PRN, Lyn Hollingshead, MD .  naloxone (NARCAN) 2 mg in dextrose 5 % 250 mL infusion, 1-4 mcg/kg/hr, Intravenous, Continuous PRN, Lyn Hollingshead, MD .  naloxone [facility]) injection 0.4 mg, 0.4 mg, Intravenous, PRN **AND** sodium chloride flush (NS) 0.9 % injection 3 mL, 3 mL,  Intravenous, PRN, Lyn Hollingshead, MD .  ondansetron [facility]) injection 4 mg, 4 mg, Intravenous, Q8H PRN, Lyn Hollingshead, MD .  oxyCODONE (Oxy IR/ROXICODONE) immediate release tablet 5 mg, 5 mg, Oral, Q3H PRN, Donnel Saxon, CNM, 5 mg at 01/05/16 2153 .  oxyCODONE-acetaminophen (PERCOCET/ROXICET) 5-325 MG per tablet 1 tablet, 1 tablet, Oral, Q4H PRN, Delsa Bern, MD, 1 tablet at 01/06/16 2326 .   oxyCODONE-acetaminophen (PERCOCET/ROXICET) 5-325 MG per tablet 2 tablet, 2 tablet, Oral, Q4H PRN, Delsa Bern, MD .  prenatal multivitamin tablet 1 tablet, 1 tablet, Oral, Q1200, Delsa Bern, MD, 1 tablet at 01/06/16 1158 .  scopolamine (TRANSDERM-SCOP) 1 MG/3DAYS 1.5 mg, 1 patch, Transdermal, Once, Lyn Hollingshead, MD, 1.5 mg at 01/04/16 2120 .  senna-docusate (Senokot-S) tablet 2 tablet, 2 tablet, Oral, Q24H, Delsa Bern, MD, 2 tablet at 01/06/16 0017 .  simethicone (MYLICON) chewable tablet 80 mg, 80 mg, Oral, TID PC, Delsa Bern, MD, 80 mg at 01/07/16 0757 .  simethicone (MYLICON) chewable tablet 80 mg, 80 mg, Oral, Q24H, Delsa Bern, MD, 80 mg at 01/06/16 0017 .  simethicone (MYLICON) chewable tablet 80 mg, 80 mg, Oral, PRN, Delsa Bern, MD, 80 mg at 01/05/16 0618 .  Tdap (BOOSTRIX) injection 0.5 mL, 0.5 mL, Intramuscular, Once, Delsa Bern, MD, 0.5 mL at 01/05/16 1000 .  zolpidem (AMBIEN) tablet 5 mg, 5 mg, Oral, QHS PRN, Delsa Bern, MD, 5 mg at 01/06/16 0022   Assessment/Plan: Status post Cesarean section. Doing well postoperatively.  Status post postpartum magnesium sulfate Continue current care. Plan for discharge today and patient to continue Lovenox for the next 6 weeks- RN to show patient how to self administer. Patient to also continue hydrochlorothiazide. Patient to DC ibuprofen because of high blood pressures. Patient to follow-up in the office for 1 week for staples removal and BP check  [facility] 01/07/2016, 9:47 AM

## 2016-01-07 NOTE — Clinical Social Work Maternal (Signed)
CLINICAL SOCIAL WORK MATERNAL/CHILD NOTE  Patient Details  Name: Stephanie [name] MRN: [id number] Date of Birth: 04/01/1986  Date:  01/07/2016  Clinical Social Worker Initiating Note:  Caleesi Kohl, LCSW Date/ Time Initiated:  01/07/16/1030     Child's Name:  Stephanie [name]    Legal Guardian:   (Parents: Stephanie [name] and Stephanie [name])   Need for Interpreter:  None   Date of Referral:        Reason for Referral:   (No referral-NICU admission)   Referral Source:      Address:  [address] [place], [place] [address]  Phone number:  [phone]   Household Members:  Minor Children, Significant Other (MOB has two other children from a previous relationship: Stephanie [name]/age 7, Stephanie [name]/age 6.  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  FOB works for [employer].)   Education:      Financial Resources:  Medicaid   Other Resources:  WIC   Cultural/Religious Considerations Which May Impact Care: None stated.  Strengths:  Ability to meet basic needs , Pediatrician chosen , Understanding of illness, Compliance with medical plan , Home prepared for child  (Pediatric follow up will be at [facility] in [place])   Risk Factors/Current Problems:  None   Cognitive State:  Able to Concentrate , Alert , Linear Thinking , Goal Oriented , Insightful    Mood/Affect:  Interested , Calm , Comfortable , Relaxed , Euthymic    CSW Assessment: CSW met with MOB and FOB in MOB's third floor room/303 to introduce services, offer support, and complete assessment due to baby's admission to NICU at 31.3 weeks.  Parents were very pleasant and welcoming of CSW's visit.  CSW found them both easy to engage. MOB states she is feeling "much better," and went on to tell CSW her birth story.   She and FOB state they feel they are coping well and MOB thinks "the worst is behind us."  She notes that her delivery was very scary.  FOB states he was not here for the birth because he was working out of town, but states the delivery happened when it needed to and he is just glad to be here now.  Parents seem to have a good understanding of baby's medical needs and provided CSW with an update.  CSW asked them to call anytime they would like to schedule a Family Conference and told them not to be alarmed if CSW contacts them to arrange, as we want to ensure good communication.  Parents were appreciative. Parents report having a good support system who they can call on as needed.  MOB states her father was with her during her delivery since FOB could not be present.  She states her mother is also a good support and FOB states his father and grandfather are very supportive.  MOB has two other children, Stephanie [name]/age 7 who is a former 28 weeker, and Stephanie [name]/age 6 who was born at term.  MOB delivered these babies in [place].  She states she has noted differences in the two NICUs but both parents state they feel comfortable with the care and communication here thus far.  FOB added that his son was in the [facility] at birth due to complications of   being post-dates.  FOB states he has a relationship with his son, who will be 4 in July, but that he does not live in the home.   Parents state they having the necessary items for baby at home, but are not organized and set up for baby yet.  CSW assured them that they have time to get prepared.   Parents are understanding that this experience is unpredictable and plan to take things one day at a time.  CSW acknowledged that it is unnatural to be separated from their baby, but encouraged them to remember that it is temporary and necessary.  CSW explained ongoing support services offered by NICU CSW and asked them to call any time.  CSW provided contact information. CSW  provided education regarding perinatal mood disorders and discussed the importance of talking with CSW and or MD if they have concerns at any time.  Again, parents state that they feel they are coping well, but both were engaged and attentive as CSW provided this information.  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   Parents state no questions, concerns or needs at this time.  No social concerns identified at this time.  CSW Plan/Description:  Patient/Family Education , Psychosocial Support and Ongoing Assessment of Needs    Stephanie Colville Elizabeth, LCSW 01/07/2016, 4:12 PM 

## 2016-01-07 NOTE — Progress Notes (Signed)
MMR vaccine and her being rubella nonimmune discussed with patient. Patient declined the vaccine at this time and stated she would get it at her follow-up appointment with her doctor.

## 2016-02-07 ENCOUNTER — Inpatient Hospital Stay (HOSPITAL_COMMUNITY)
Admission: RE | Admit: 2016-02-07 | Payer: Medicaid Other | Source: Ambulatory Visit | Admitting: Obstetrics and Gynecology

## 2016-02-07 SURGERY — Surgical Case
Anesthesia: Regional

## 2016-02-16 ENCOUNTER — Emergency Department: Payer: Medicaid Other

## 2016-02-16 ENCOUNTER — Emergency Department
Admission: EM | Admit: 2016-02-16 | Discharge: 2016-02-16 | Disposition: A | Discharge status: Home / Self Care | Payer: Medicaid Other | Attending: Emergency Medicine | Admitting: Emergency Medicine

## 2016-02-16 DIAGNOSIS — I10 Essential (primary) hypertension: Secondary | ICD-10-CM | POA: Insufficient documentation

## 2016-02-16 DIAGNOSIS — R52 Pain, unspecified: Secondary | ICD-10-CM

## 2016-02-16 DIAGNOSIS — R2241 Localized swelling, mass and lump, right lower limb: Secondary | ICD-10-CM | POA: Diagnosis present

## 2016-02-16 DIAGNOSIS — I809 Phlebitis and thrombophlebitis of unspecified site: Secondary | ICD-10-CM

## 2016-02-16 DIAGNOSIS — I8001 Phlebitis and thrombophlebitis of superficial vessels of right lower extremity: Secondary | ICD-10-CM | POA: Diagnosis not present

## 2016-02-16 LAB — CBC WITH DIFFERENTIAL/PLATELET
BASOS ABS: 0.1 10*3/uL (ref 0–0.1)
Basophils Relative: 1 %
EOS PCT: 2 %
Eosinophils Absolute: 0.2 10*3/uL (ref 0–0.7)
HEMATOCRIT: 31.8 % — AB (ref 35.0–47.0)
Hemoglobin: 10.5 g/dL — ABNORMAL LOW (ref 12.0–16.0)
LYMPHS PCT: 33 %
Lymphs Abs: 3.1 10*3/uL (ref 1.0–3.6)
MCH: 24 pg — ABNORMAL LOW (ref 26.0–34.0)
MCHC: 33.2 g/dL (ref 32.0–36.0)
MCV: 72.2 fL — AB (ref 80.0–100.0)
Monocytes Absolute: 0.4 10*3/uL (ref 0.2–0.9)
Monocytes Relative: 4 %
NEUTROS ABS: 5.5 10*3/uL (ref 1.4–6.5)
NEUTROS PCT: 60 %
PLATELETS: 388 10*3/uL (ref 150–440)
RBC: 4.4 MIL/uL (ref 3.80–5.20)
RDW: 16.2 % — ABNORMAL HIGH (ref 11.5–14.5)
WBC: 9.3 10*3/uL (ref 3.6–11.0)

## 2016-02-16 LAB — COMPREHENSIVE METABOLIC PANEL
ALBUMIN: 3.6 g/dL (ref 3.5–5.0)
ALK PHOS: 54 U/L (ref 38–126)
ALT: 18 U/L (ref 14–54)
ANION GAP: 5 (ref 5–15)
AST: 16 U/L (ref 15–41)
BUN: 10 mg/dL (ref 6–20)
CO2: 25 mmol/L (ref 22–32)
Calcium: 8.8 mg/dL — ABNORMAL LOW (ref 8.9–10.3)
Chloride: 110 mmol/L (ref 101–111)
Creatinine, Ser: 0.73 mg/dL (ref 0.44–1.00)
GFR calc non Af Amer: >60 mL/min (ref >60)
Glucose, Bld: 98 mg/dL (ref 65–99)
POTASSIUM: 3.6 mmol/L (ref 3.5–5.1)
SODIUM: 140 mmol/L (ref 135–145)
TOTAL PROTEIN: 6.8 g/dL (ref 6.5–8.1)
Total Bilirubin: 0.5 mg/dL (ref 0.3–1.2)

## 2016-02-16 MED ORDER — CEPHALEXIN 500 MG PO CAPS: 500.0000 mg | ORAL_CAPSULE | Freq: Three times a day (TID) | ORAL | Status: AC

## 2016-02-16 MED ORDER — IBUPROFEN 800 MG PO TABS: 800.0000 mg | ORAL_TABLET | Freq: Three times a day (TID) | ORAL | Status: DC | PRN

## 2016-02-16 NOTE — ED Notes (Signed)
Patient transported to Ultrasound 

## 2016-02-16 NOTE — ED Notes (Signed)
Pt states has a family emergency and has to leave - dr made aware and d/c instructions obtained

## 2016-02-16 NOTE — ED Provider Notes (Signed)
[facility] Emergency Department Provider Note   ____________________________________________  Time seen: Approximately 11:38 AM  I have reviewed the triage vital signs and the nursing notes.   HISTORY  Chief Complaint Leg Swelling    HPI Stephanie [name] is a 30 y.o. female patient delivered June 9 by C-section for preeclampsia. Baby was 2 months early. Patient had some phlebitis in the leg medially at the knee previously symptoms went away and now they have returned this redness pain and swelling which is progressing both up the leg and from the medial portion leg around towards the frontal leg. Patient has pain there but no fever. The redness has increased in size over the course of the last night. Pain is mild to moderate not severe   Past Medical History  Diagnosis Date  . Hypertension   . Preeclampsia   . Hypothyroidism   . Pulmonary edema     third trimester    Patient Active Problem List   Diagnosis Date Noted  . Status post repeat low transverse cesarean section--due to severe pre-eclampsia 01/05/2016  . Pre-eclampsia, severe 01/04/2016  . Hypertension -- suspect preeclampsia 01/03/2016  . Gestational hypertension w/o significant proteinuria in 3rd trimester 01/03/2016  . Hyperthyroidism 01/02/2016  . Hx of preeclampsia, prior pregnancy, currently pregnant 01/02/2016  . Thrombophlebitis leg superficial 11/29/2015  . Placenta previa 11/29/2015  . H/O: C-section 11/29/2015  . H/O migraine (no meds) 11/29/2015  . Obesity (BMI 30-39.9) 11/29/2015  . Rubella non-immune status, antepartum 11/29/2015  . Hypokalemia 11/29/2015  . H/O gastroesophageal reflux (GERD) 11/29/2015  . Restless leg syndrome 11/29/2015    Past Surgical History  Procedure Laterality Date  . Cesarean section    . Cesarean section N/A 01/04/2016    Procedure: CESAREAN SECTION;  Surgeon: Silverio Lay, MD;  Location: [facility] BIRTHING SUITES;  Service: Obstetrics;   Laterality: N/A;    Current Outpatient Rx  Name  Route  Sig  Dispense  Refill  . cephALEXin (KEFLEX) 500 MG capsule   Oral   Take 1 capsule (500 mg total) by mouth 3 (three) times daily.   40 capsule   0   . docusate sodium (COLACE) 100 MG capsule   Oral   Take 1 capsule (100 mg total) by mouth 2 (two) times daily as needed for mild constipation or moderate constipation.   30 capsule   1   . enoxaparin (LOVENOX) 60 MG/0.6ML injection   Subcutaneous   Inject 0.6 mLs (60 mg total) into the skin daily.   45 Syringe   0   . ferrous sulfate 325 (65 FE) MG tablet   Oral   Take 1 tablet (325 mg total) by mouth 2 (two) times daily with a meal.   60 tablet   3   . hydrochlorothiazide (HYDRODIURIL) 25 MG tablet   Oral   Take 1 tablet (25 mg total) by mouth daily.   30 tablet   1   . ibuprofen (ADVIL,MOTRIN) 800 MG tablet   Oral   Take 1 tablet (800 mg total) by mouth every 8 (eight) hours as needed.   30 tablet   0   . oxyCODONE-acetaminophen (PERCOCET/ROXICET) 5-325 MG tablet   Oral   Take 1 tablet by mouth every 4 (four) hours as needed (pain scale 4-7).   30 tablet   0   . Prenatal Vit-Fe Fumarate-FA (PRENATAL MULTIVITAMIN) TABS tablet   Oral   Take 1 tablet by mouth daily at 12 noon.  Allergies Review of patient's allergies indicates no known allergies.  Family History  Problem Relation Age of Onset  . Hypertension Father     Social History Social History  Substance Use Topics  . Smoking status: Never Smoker   . Smokeless tobacco: Never Used  . Alcohol Use: No    Review of Systems Constitutional: No fever/chills Eyes: No visual changes. ENT: No sore throat. Cardiovascular: Denies chest pain. Respiratory: Denies shortness of breath. Gastrointestinal: No abdominal pain.  No nausea, no vomiting.  No diarrhea.  No constipation. Genitourinary: Negative for dysuria. Musculoskeletal: Negative for back pain. Skin: Negative for  rash. Neurological: Negative for headaches, focal weakness or numbness.  10-point ROS otherwise negative.  ____________________________________________   PHYSICAL EXAM:  VITAL SIGNS: ED Triage Vitals  Enc Vitals Group     BP 02/16/16 1040 138/86 mmHg     Pulse Rate 02/16/16 1040 65     Resp 02/16/16 1040 18     Temp 02/16/16 1040 98.4 F (36.9 C)     Temp Source 02/16/16 1040 Oral     SpO2 02/16/16 1040 98 %     Weight 02/16/16 1040 233 lb (105.688 kg)     Height 02/16/16 1040 5\' 6"  (1.676 m)     Head Cir --      Peak Flow --      Pain Score 02/16/16 1041 9     Pain Loc --      Pain Edu? --      Excl. in GC? --     Constitutional: Alert and oriented. Well appearing and in no acute distress. Eyes: Conjunctivae are normal. PERRL. EOMI. Head: Atraumatic. Nose: No congestion/rhinnorhea. Mouth/Throat: Mucous membranes are moist.   Neck: No stridor.   Cardiovascular: Normal rate, regular rhythm. Grossly normal heart sounds.  Good peripheral circulation. Respiratory: Normal respiratory effort.  No retractions. Lungs CTAB. Gastrointestinal: Soft and nontender. No distention. No abdominal bruits. No CVA tenderness. Musculoskeletal: Right leg medial to the knee there is a red cruciform shaped area irregular in its contours approximately 6 x 5 cm. The knee joint is not involved is not swollen patient has good range of motion leg is normal distally  No joint effusions. Neurologic:  Normal speech and language. No gross focal neurologic deficits are appreciated. No gait instability. Skin:  Skin is warm, dry and intact. No rash noted. Psychiatric: Mood and affect are normal. Speech and behavior are normal.  ____________________________________________   LABS (all labs ordered are listed, but only abnormal results are displayed)  Labs Reviewed  COMPREHENSIVE METABOLIC PANEL - Abnormal; Notable for the following:    Calcium 8.8 (*)    All other components within normal limits  CBC  WITH DIFFERENTIAL/PLATELET - Abnormal; Notable for the following:    Hemoglobin 10.5 (*)    HCT 31.8 (*)    MCV 72.2 (*)    MCH 24.0 (*)    RDW 16.2 (*)    All other components within normal limits   ____________________________________________  EKG  ____________________________________________  RAD etiology reads the ultrasound is superficial thrombophlebitis __________________________________________   PROCEDURES    Procedures    ____________________________________________   INITIAL IMPRESSION / ASSESSMENT AND PLAN / ED COURSE  Pertinent labs & imaging results that were available during my care of the patient were reviewed by me and considered in my medical decision making (see chart for details).  Patient is in a big hurry to go upstairs to see her baby. Patient will not wait any  longer. I discussed with patient thrombophlebitis is superficial and should be able to be treated with warmth movement and Motrin. She is to return if she is worse. Since it is spreading laterally also give her some Keflex just in case. He'll return again if it's worse and she will follow-up with her doctor she will also try to follow-up with Dr. due to see if it would be benefit in removing the pain since it is recurrent in the same place. ____________________________________________   FINAL CLINICAL IMPRESSION(S) / ED DIAGNOSES  Final diagnoses:  Superficial thrombophlebitis      NEW MEDICATIONS STARTED DURING THIS VISIT:  Discharge Medication List as of 02/16/2016 12:53 PM    START taking these medications   Details  cephALEXin (KEFLEX) 500 MG capsule Take 1 capsule (500 mg total) by mouth 3 (three) times daily., Starting 02/16/2016, Until Tue 02/26/16, Print    ibuprofen (ADVIL,MOTRIN) 800 MG tablet Take 1 tablet (800 mg total) by mouth every 8 (eight) hours as needed., Starting 02/16/2016, Until Discontinued, Print         Note:  This document was prepared using Dragon voice  recognition software and may include unintentional dictation errors.    Arnaldo Natal, MD 02/16/16 [phone]

## 2016-02-16 NOTE — ED Notes (Signed)
Pt states that she was diagnosed with a blood clot to the rt lower leg while pregnant, pt delivered 2 mos early due to preeclampsia and pulmonary edema, pt has increased swelling and bruising with heat below the rt knee

## 2016-02-16 NOTE — ED Notes (Signed)
See triage note. Pt states she has bruising and redness to R lower leg at same site as a blood clot she was diagnosed with while pregnant. Pt states that redness began 2 days ago and has spread 1 inch further up her leg since last night.

## 2016-02-16 NOTE — ED Notes (Signed)
Pt has not been on a blood thinner since her previous dx 2 months ago. Pt states she marked with a marker (the little red dots) and that the one area is moving up the leg.

## 2016-02-16 NOTE — ED Notes (Signed)
Pt back from us

## 2016-02-28 ENCOUNTER — Encounter: Payer: Self-pay | Admitting: Vascular Surgery

## 2016-02-29 ENCOUNTER — Other Ambulatory Visit: Payer: Self-pay

## 2016-03-03 ENCOUNTER — Encounter: Payer: Medicaid Other | Admitting: Vascular Surgery

## 2016-06-13 ENCOUNTER — Encounter: Payer: Self-pay | Admitting: *Deleted

## 2016-06-13 ENCOUNTER — Ambulatory Visit (INDEPENDENT_AMBULATORY_CARE_PROVIDER_SITE_OTHER): Payer: Self-pay | Admitting: *Deleted

## 2016-06-13 DIAGNOSIS — N912 Amenorrhea, unspecified: Secondary | ICD-10-CM

## 2016-06-13 DIAGNOSIS — Z3201 Encounter for pregnancy test, result positive: Secondary | ICD-10-CM

## 2016-06-13 DIAGNOSIS — Z3A01 Less than 8 weeks gestation of pregnancy: Secondary | ICD-10-CM

## 2016-06-13 LAB — POCT URINE PREGNANCY: PREG TEST UR: POSITIVE — AB

## 2016-06-13 MED ORDER — CITRANATAL B-CALM 20-1 MG & 2 X 25 MG PO MISC: 3.0000 | Freq: Every day | ORAL | 11 refills | Status: DC

## 2016-06-13 NOTE — Progress Notes (Signed)
7355w3d EDD 02/10/17 Prenatal vitamins sent to pharmacy NOB appointment to be scheduled

## 2016-07-18 ENCOUNTER — Ambulatory Visit (INDEPENDENT_AMBULATORY_CARE_PROVIDER_SITE_OTHER): Payer: Medicaid Other | Admitting: Certified Nurse Midwife

## 2016-07-18 ENCOUNTER — Encounter: Payer: Self-pay | Admitting: Certified Nurse Midwife

## 2016-07-18 ENCOUNTER — Other Ambulatory Visit (HOSPITAL_COMMUNITY)
Admission: RE | Admit: 2016-07-18 | Discharge: 2016-07-18 | Disposition: A | Discharge status: Home / Self Care | Payer: Medicaid Other | Source: Ambulatory Visit | Attending: Obstetrics | Admitting: Obstetrics

## 2016-07-18 VITALS — BP 134/84 | HR 74 | Wt 245.0 lb

## 2016-07-18 DIAGNOSIS — Z8632 Personal history of gestational diabetes: Secondary | ICD-10-CM | POA: Diagnosis not present

## 2016-07-18 DIAGNOSIS — O99211 Obesity complicating pregnancy, first trimester: Secondary | ICD-10-CM | POA: Diagnosis not present

## 2016-07-18 DIAGNOSIS — O09291 Supervision of pregnancy with other poor reproductive or obstetric history, first trimester: Secondary | ICD-10-CM

## 2016-07-18 DIAGNOSIS — E669 Obesity, unspecified: Secondary | ICD-10-CM | POA: Diagnosis not present

## 2016-07-18 DIAGNOSIS — Z01419 Encounter for gynecological examination (general) (routine) without abnormal findings: Secondary | ICD-10-CM | POA: Insufficient documentation

## 2016-07-18 DIAGNOSIS — Z3481 Encounter for supervision of other normal pregnancy, first trimester: Secondary | ICD-10-CM | POA: Diagnosis not present

## 2016-07-18 DIAGNOSIS — O0991 Supervision of high risk pregnancy, unspecified, first trimester: Secondary | ICD-10-CM | POA: Diagnosis not present

## 2016-07-18 DIAGNOSIS — Z8719 Personal history of other diseases of the digestive system: Secondary | ICD-10-CM

## 2016-07-18 DIAGNOSIS — O219 Vomiting of pregnancy, unspecified: Secondary | ICD-10-CM

## 2016-07-18 DIAGNOSIS — O34219 Maternal care for unspecified type scar from previous cesarean delivery: Secondary | ICD-10-CM | POA: Diagnosis not present

## 2016-07-18 DIAGNOSIS — E0590 Thyrotoxicosis, unspecified without thyrotoxic crisis or storm: Secondary | ICD-10-CM

## 2016-07-18 DIAGNOSIS — Z8669 Personal history of other diseases of the nervous system and sense organs: Secondary | ICD-10-CM

## 2016-07-18 DIAGNOSIS — O09299 Supervision of pregnancy with other poor reproductive or obstetric history, unspecified trimester: Secondary | ICD-10-CM

## 2016-07-18 DIAGNOSIS — O0990 Supervision of high risk pregnancy, unspecified, unspecified trimester: Secondary | ICD-10-CM | POA: Insufficient documentation

## 2016-07-18 DIAGNOSIS — Z8759 Personal history of other complications of pregnancy, childbirth and the puerperium: Secondary | ICD-10-CM

## 2016-07-18 DIAGNOSIS — Z98891 History of uterine scar from previous surgery: Secondary | ICD-10-CM

## 2016-07-18 MED ORDER — LABETALOL HCL 200 MG PO TABS: 200.0000 mg | ORAL_TABLET | Freq: Two times a day (BID) | ORAL | 3 refills | Status: DC

## 2016-07-18 MED ORDER — DOXYLAMINE-PYRIDOXINE 10-10 MG PO TBEC: DELAYED_RELEASE_TABLET | ORAL | 4 refills | Status: DC

## 2016-07-18 NOTE — Progress Notes (Signed)
Pt unsure LMP aprx. 05/06/16. Agrees to genetic screenings.

## 2016-07-18 NOTE — Progress Notes (Addendum)
Subjective:    Stephanie [name] is being seen today for her first obstetrical visit.  This is a planned pregnancy. She is at 425w3d gestation. Her obstetrical history is significant for obesity, pre-eclampsia and pulmonary edema. Relationship with FOB: spouse, living together. Patient does intend to breast feed. Pregnancy history fully reviewed. Patient is a twin and spouse is a twin.   The information documented in the HPI was reviewed and verified.  Menstrual History: OB History    Gravida Para Term Preterm AB Living   4 3 1 2   3    SAB TAB Ectopic Multiple Live Births         0 3       Patient's last menstrual period was 05/06/2016 (within weeks).    Past Medical History:  Diagnosis Date  . Hypertension   . Hypothyroidism   . Preeclampsia   . Pulmonary edema    third trimester    Past Surgical History:  Procedure Laterality Date  . CESAREAN SECTION    . CESAREAN SECTION N/A 01/04/2016   Procedure: CESAREAN SECTION;  Surgeon: Silverio LaySandra [name], MD;  Location[facility] BIR[facility];  Service: Obstetrics;  Laterality: N/A;     (Not in a hospital admission) No Known Allergies  Social History  Substance Use Topics  . Smoking status: Never Smoker  . Smokeless tobacco: Never Used  . Alcohol use No    Family History  Problem Relation Age of Onset  . Hypertension Father   . Diabetes Maternal Grandmother      Review of Systems Constitutional: negative for weight loss Gastrointestinal: negative for vomiting Genitourinary:negative for genital lesions and vaginal discharge and dysuria Musculoskeletal:negative for back pain Behavioral/Psych: negative for abusive relationship, depression, illegal drug usage and tobacco use    Objective:    LMP 05/06/2016 (Within Weeks)  General Appearance:    Alert, cooperative, no distress, appears stated age  Head:    Normocephalic, without obvious abnormality, atraumatic  Eyes:    PERRL, conjunctiva/corneas clear, EOM's intact, fundi   benign, both eyes  Ears:    Normal TM's and external ear canals, both ears  Nose:   Nares normal, septum midline, mucosa normal, no drainage    or sinus tenderness  Throat:   Lips, mucosa, and tongue normal; teeth and gums normal  Neck:   Supple, symmetrical, trachea midline, no adenopathy;    thyroid:  no enlargement/tenderness/nodules; no carotid   bruit or JVD  Back:     Symmetric, no curvature, ROM normal, no CVA tenderness  Lungs:     Clear to auscultation bilaterally, respirations unlabored  Chest Wall:    No tenderness or deformity   Heart:    Regular rate and rhythm, S1 and S2 normal, no murmur, rub   or gallop  Breast Exam:    No tenderness, masses, or nipple abnormality  Abdomen:     Soft, non-tender, bowel sounds active all four quadrants,    no masses, no organomegaly  Genitalia:    Normal female without lesion, discharge or tenderness  Extremities:   Extremities normal, atraumatic, no cyanosis or edema  Pulses:   2+ and symmetric all extremities  Skin:   Skin color, texture, turgor normal, no rashes or lesions  Lymph nodes:   Cervical, supraclavicular, and axillary nodes normal  Neurologic:   CNII-XII intact, normal strength, sensation and reflexes    throughout          Cervix: long, thick, closed and posterior.  FH: below symphysis  pubis.  Cardiac activty present with SonoSite.     Lab Review Urine pregnancy test Labs reviewed yes Radiologic studies reviewed yes Assessment:    Pregnancy at [redacted]w[redacted]d weeks   H/O preeclampsia with pulmonary edema and RLTCS last pregnancy  H/O previa  H/O Migraines  Obesity  H/O GERD  H/O Hyperthyroidism  Supervision of high risk pregnancy d/t preeclampsia history  Unsure of LMP  Repeat C-section with BTL planned Plan:     [place] @ MFM Baby ASA Prenatal vitamins.  Counseling provided regarding continued use of seat belts, cessation of alcohol consumption, smoking or use of illicit drugs; infection precautions i.e., influenza/TDAP  immunizations, toxoplasmosis,CMV, parvovirus, listeria and varicella; workplace safety, exercise during pregnancy; routine dental care, safe medications, sexual activity, hot tubs, saunas, pools, travel, caffeine use, fish and methlymercury, potential toxins, hair treatments, varicose veins Weight gain recommendations per IOM guidelines reviewed: underweight/BMI< 18.5--> gain 28 - 40 lbs; normal weight/BMI 18.5 - 24.9--> gain 25 - 35 lbs; overweight/BMI 25 - 29.9--> gain 15 - 25 lbs; obese/BMI >30->gain  11 - 20 lbs Problem list reviewed and updated. FIRST/CF mutation testing/NIPT/QUAD SCREEN/fragile X/Ashkenazi Jewish population testing/Spinal muscular atrophy discussed: ordered. Role of ultrasound in pregnancy discussed; fetal survey: requested. Amniocentesis discussed: not indicated. VBAC calculator score: VBAC consent form provided Meds ordered this encounter  Medications  . labetalol (NORMODYNE) 200 MG tablet    Sig: Take 1 tablet (200 mg total) by mouth 2 (two) times daily.    Dispense:  60 tablet    Refill:  3  . Doxylamine-Pyridoxine (DICLEGIS) 10-10 MG TBEC    Sig: Take 1 tablet with breakfast and lunch.  Take 2 tablets at bedtime.    Dispense:  100 tablet    Refill:  4   Orders Placed This Encounter  Procedures  . Culture, OB Urine  . US OB Comp Less 14 Wks    Standing Status:   Future    Standing Expiration Date:   09/18/2017    Order Specific Question:   Reason for Exam (SYMPTOM  OR DIAGNOSIS REQUIRED)    Answer:   dating, maternal obesity, hx of placenta previa    Order Specific Question:   Preferred imaging location?    Answer:   [facility]  . ToxASSURE Select 13 (MW), Urine  . MaterniT21  plus Core+ESS+SCA, Blood    Order Specific Question:   Is the patient insulin dependent?    Answer:   No    Order Specific Question:   Please enter gestational age. This should be expressed as weeks AND days, i.e. 16w 6d. Enter weeks here. Enter days in next question.    Answer:    74    Order Specific Question:   Please enter gestational age. This should be expressed as weeks AND days, i.e. 16w 6d. Enter days here. Enter weeks in previous question.    Answer:   3    Order Specific Question:   How was gestational age calculated?    Answer:   LMP    Order Specific Question:   Please give the date of LMP OR Ultrasound OR Estimated date of delivery.    Answer:   05/06/2016    Order Specific Question:   Number of Fetuses (Type of Pregnancy):    Answer:   1    Order Specific Question:   Indications for performing the test? (please choose all that apply):    Answer:   Routine screening    Order Specific Question:  If this is a repeat specimen, please indicate the reason:    Answer:   Early gestational age    Order Specific Question:   Please specify the patient's race: (C=White/Caucasion, B=Black, I=Native American, A=Asian, H=Hispanic, O=Other, U=Unknown)    Answer:   C    Order Specific Question:   Donor Egg - indicate if the egg was obtained from in vitro fertilization.    Answer:   N    Order Specific Question:   Age of Egg Donor.    Answer:   0    Order Specific Question:   Prior Down Syndrome/ONTD screening during current pregnancy.    Answer:   N    Order Specific Question:   Prior First Trimester Testing    Answer:   N    Order Specific Question:   Prior Second Trimester Testing    Answer:   N    Order Specific Question:   Family History of Neural Tube Defects    Answer:   N    Order Specific Question:   Prior Pregnancy with Down Syndrome    Answer:   N    Order Specific Question:   Please give the patient's weight (in pounds)    Answer:   245  . Hemoglobinopathy evaluation  . Obstetric Panel, Including HIV  . TSH  . Cystic fibrosis diagnostic study    Order Specific Question:   Patient Ethnicity:    Answer:   Caucasian  . Varicella zoster antibody, IgG  . Hemoglobin A1c  . NuSwab Vaginitis Plus (VG+)    Follow up in 4 weeks. 50% of 30 min visit  spent on counseling and coordination of care.

## 2016-07-18 NOTE — Addendum Note (Signed)
Addended by: Samantha CrimesENNEY, Noel Henandez ANNE on: 07/18/2016 11:25 AM   Modules accepted: Orders

## 2016-07-20 LAB — URINE CULTURE, OB REFLEX

## 2016-07-20 LAB — CULTURE, OB URINE

## 2016-07-22 LAB — OBSTETRIC PANEL, INCLUDING HIV
Antibody Screen: NEGATIVE
BASOS ABS: 0 10*3/uL (ref 0.0–0.2)
Basos: 0 %
EOS (ABSOLUTE): 0.1 10*3/uL (ref 0.0–0.4)
Eos: 1 %
HIV SCREEN 4TH GENERATION: NONREACTIVE
Hematocrit: 37.2 % (ref 34.0–46.6)
Hemoglobin: 11.4 g/dL (ref 11.1–15.9)
Hepatitis B Surface Ag: NEGATIVE
IMMATURE GRANULOCYTES: 0 %
Immature Grans (Abs): 0 10*3/uL (ref 0.0–0.1)
LYMPHS ABS: 2.9 10*3/uL (ref 0.7–3.1)
Lymphs: 26 %
MCH: 20.3 pg — AB (ref 26.6–33.0)
MCHC: 30.6 g/dL — AB (ref 31.5–35.7)
MCV: 66 fL — AB (ref 79–97)
MONOS ABS: 0.5 10*3/uL (ref 0.1–0.9)
Monocytes: 5 %
NEUTROS ABS: 7.6 10*3/uL — AB (ref 1.4–7.0)
NEUTROS PCT: 68 %
PLATELETS: 424 10*3/uL — AB (ref 150–379)
RBC: 5.61 x10E6/uL — AB (ref 3.77–5.28)
RDW: 17.5 % — ABNORMAL HIGH (ref 12.3–15.4)
RH TYPE: POSITIVE
RPR Ser Ql: NONREACTIVE
Rubella Antibodies, IGG: <0.9 index — ABNORMAL LOW (ref >0.99)
WBC: 11.3 10*3/uL — AB (ref 3.4–10.8)

## 2016-07-22 LAB — HEMOGLOBIN A1C
Est. average glucose Bld gHb Est-mCnc: 111 mg/dL
HEMOGLOBIN A1C: 5.5 % (ref 4.8–5.6)

## 2016-07-22 LAB — HEMOGLOBINOPATHY EVALUATION
HEMOGLOBIN F QUANTITATION: 0 % (ref 0.0–2.0)
HGB A: 98.1 % (ref 96.4–98.8)
HGB C: 0 %
HGB S: 0 %
Hemoglobin A2 Quantitation: 1.9 % (ref 1.8–3.2)

## 2016-07-22 LAB — TSH: TSH: 3.25 u[IU]/mL (ref 0.450–4.500)

## 2016-07-22 LAB — VARICELLA ZOSTER ANTIBODY, IGG: VARICELLA: 381 {index} (ref >165)

## 2016-07-23 ENCOUNTER — Other Ambulatory Visit: Payer: Self-pay | Admitting: Certified Nurse Midwife

## 2016-07-23 ENCOUNTER — Ambulatory Visit (HOSPITAL_COMMUNITY): Payer: Medicaid Other

## 2016-07-23 DIAGNOSIS — O0990 Supervision of high risk pregnancy, unspecified, unspecified trimester: Secondary | ICD-10-CM

## 2016-07-24 LAB — NUSWAB VAGINITIS PLUS (VG+)
CANDIDA ALBICANS, NAA: NEGATIVE
CHLAMYDIA TRACHOMATIS, NAA: NEGATIVE
Candida glabrata, NAA: NEGATIVE
Neisseria gonorrhoeae, NAA: NEGATIVE
Trich vag by NAA: NEGATIVE

## 2016-07-24 LAB — CYTOLOGY - PAP: DIAGNOSIS: NEGATIVE

## 2016-07-25 ENCOUNTER — Other Ambulatory Visit: Payer: Self-pay | Admitting: Certified Nurse Midwife

## 2016-07-25 ENCOUNTER — Ambulatory Visit (HOSPITAL_COMMUNITY)
Admission: RE | Admit: 2016-07-25 | Discharge: 2016-07-25 | Disposition: A | Discharge status: Home / Self Care | Payer: Medicaid Other | Source: Ambulatory Visit | Attending: Certified Nurse Midwife | Admitting: Certified Nurse Midwife

## 2016-07-25 DIAGNOSIS — E669 Obesity, unspecified: Secondary | ICD-10-CM | POA: Insufficient documentation

## 2016-07-25 DIAGNOSIS — O99211 Obesity complicating pregnancy, first trimester: Secondary | ICD-10-CM | POA: Insufficient documentation

## 2016-07-25 DIAGNOSIS — O0991 Supervision of high risk pregnancy, unspecified, first trimester: Secondary | ICD-10-CM

## 2016-07-25 DIAGNOSIS — Z3A10 10 weeks gestation of pregnancy: Secondary | ICD-10-CM | POA: Diagnosis not present

## 2016-07-25 DIAGNOSIS — Z3689 Encounter for other specified antenatal screening: Secondary | ICD-10-CM | POA: Insufficient documentation

## 2016-07-25 DIAGNOSIS — O0990 Supervision of high risk pregnancy, unspecified, unspecified trimester: Secondary | ICD-10-CM

## 2016-07-27 LAB — MATERNIT21  PLUS CORE+ESS+SCA, BLOOD
CHROMOSOME 13: NEGATIVE
CHROMOSOME 18: NEGATIVE
CHROMOSOME 21: NEGATIVE
PDF: 0
Y Chromosome: NOT DETECTED

## 2016-07-28 NOTE — L&D Delivery Note (Signed)
Delivery Note Called to room as fetus noted to be partially delivered.   Baby was delivered up to neck.  Head was through cervix and in vagina, delivered easily.    At 1:32 PM a non-viable female was delivered via VBAC, Spontaneous (Presentation: breech ).  APGAR: 0, 0; weight  .   Placenta status: delivered spontaneously and grossly intact.  Placental lobes noted to be calcified and there is a succenturate lobe attached by membranes.   Anesthesia:   Episiotomy: None Lacerations: None Suture Repair: none Est. Blood Loss (mL): 50 Placenta sent to pathology  Mom to Perinatal Unit.  Baby to [place].  Wynelle Bourgeois 11/05/2016, 2:16 PM

## 2016-07-29 LAB — TOXASSURE SELECT 13 (MW), URINE

## 2016-07-30 ENCOUNTER — Other Ambulatory Visit: Payer: Self-pay | Admitting: Certified Nurse Midwife

## 2016-07-30 DIAGNOSIS — O0990 Supervision of high risk pregnancy, unspecified, unspecified trimester: Secondary | ICD-10-CM

## 2016-08-01 ENCOUNTER — Other Ambulatory Visit: Payer: Medicaid Other

## 2016-08-18 ENCOUNTER — Encounter: Payer: Medicaid Other | Admitting: Obstetrics and Gynecology

## 2016-08-27 ENCOUNTER — Ambulatory Visit (INDEPENDENT_AMBULATORY_CARE_PROVIDER_SITE_OTHER): Payer: Medicaid Other | Admitting: Obstetrics and Gynecology

## 2016-08-27 VITALS — BP 132/88 | HR 76 | Wt 246.0 lb

## 2016-08-27 DIAGNOSIS — O09292 Supervision of pregnancy with other poor reproductive or obstetric history, second trimester: Secondary | ICD-10-CM

## 2016-08-27 DIAGNOSIS — Z8632 Personal history of gestational diabetes: Secondary | ICD-10-CM

## 2016-08-27 DIAGNOSIS — O09299 Supervision of pregnancy with other poor reproductive or obstetric history, unspecified trimester: Secondary | ICD-10-CM

## 2016-08-27 DIAGNOSIS — E669 Obesity, unspecified: Secondary | ICD-10-CM

## 2016-08-27 DIAGNOSIS — Z98891 History of uterine scar from previous surgery: Secondary | ICD-10-CM

## 2016-08-27 DIAGNOSIS — O0990 Supervision of high risk pregnancy, unspecified, unspecified trimester: Secondary | ICD-10-CM

## 2016-08-27 NOTE — Progress Notes (Signed)
Subjective:  Stephanie [name] is a 31 y.o. [id number] at 7310w1d being seen today for ongoing prenatal care.  She is currently monitored for the following issues for this high-risk pregnancy and has H/O placenta previa; H/O: C-section; H/O migraine (no meds); Obesity (BMI 30-39.9); Rubella non-immune status, antepartum; H/O gastroesophageal reflux (GERD); Restless leg syndrome; Hx of preeclampsia, prior pregnancy, currently pregnant; Supervision of high risk pregnancy, antepartum; and History of gestational diabetes mellitus (GDM) on her problem list.  Patient reports no complaints.  Contractions: Not present. Vag. Bleeding: None.   . Denies leaking of fluid.   The following portions of the patient's history were reviewed and updated as appropriate: allergies, current medications, past family history, past medical history, past social history, past surgical history and problem list. Problem list updated.  Objective:   Vitals:   08/27/16 1542 08/27/16 1550  BP: 130/88 132/88  Pulse: 69 76  Weight: 247 lb (112 kg)     Fetal Status: Fetal Heart Rate (bpm): 149         General:  Alert, oriented and cooperative. Patient is in no acute distress.  Skin: Skin is warm and dry. No rash noted.   Cardiovascular: Normal heart rate noted  Respiratory: Normal respiratory effort, no problems with respiration noted  Abdomen: Soft, gravid, appropriate for gestational age. Pain/Pressure: Absent     Pelvic:  Cervical exam deferred        Extremities: Normal range of motion.  Edema: None  Mental Status: Normal mood and affect. Normal behavior. Normal judgment and thought content.   Urinalysis:      Assessment and Plan:  Pregnancy: [id number] at 5010w1d  1. Supervision of high risk pregnancy, antepartum Importance of taking PNV and BASA reviewed with pt Decline flu vaccine - US MFM OB COMP + 14 WK; Future  2. H/O: C-section Will need repeat Also desires BTL  3. Obesity (BMI 30-39.9) Nl Hgb A1c  4. Hx of  preeclampsia, prior pregnancy, currently pregnant No history of HTN outside of pregnancy and no BP meds outside of pregnancy Pt doe shave a BP cuff. Instructed to monitor BP 2-3 times a day while at home and record Instructed to bring BP readings and cuff at next OB appt Importance of BASA reviewed Needs Urine P/Crt at next visit. 5. History of gestational diabetes mellitus (GDM) Hgb A 1 C normal  Preterm labor symptoms and general obstetric precautions including but not limited to vaginal bleeding, contractions, leaking of fluid and fetal movement were reviewed in detail with the patient. Please refer to After Visit Summary for other counseling recommendations.  Return in about 4 weeks (around 09/24/2016).   Hermina StaggersMichael L Ervin, MD

## 2016-09-12 ENCOUNTER — Encounter (HOSPITAL_COMMUNITY): Payer: Self-pay

## 2016-09-16 ENCOUNTER — Ambulatory Visit (HOSPITAL_COMMUNITY)
Admission: RE | Admit: 2016-09-16 | Discharge: 2016-09-16 | Disposition: A | Discharge status: Home / Self Care | Payer: Medicaid Other | Source: Ambulatory Visit | Attending: Obstetrics and Gynecology | Admitting: Obstetrics and Gynecology

## 2016-09-16 ENCOUNTER — Other Ambulatory Visit: Payer: Self-pay | Admitting: Obstetrics and Gynecology

## 2016-09-16 ENCOUNTER — Other Ambulatory Visit (HOSPITAL_COMMUNITY): Payer: Self-pay | Admitting: *Deleted

## 2016-09-16 ENCOUNTER — Encounter (HOSPITAL_COMMUNITY): Payer: Self-pay

## 2016-09-16 DIAGNOSIS — O34211 Maternal care for low transverse scar from previous cesarean delivery: Secondary | ICD-10-CM | POA: Diagnosis not present

## 2016-09-16 DIAGNOSIS — Z3A19 19 weeks gestation of pregnancy: Secondary | ICD-10-CM

## 2016-09-16 DIAGNOSIS — O34219 Maternal care for unspecified type scar from previous cesarean delivery: Secondary | ICD-10-CM

## 2016-09-16 DIAGNOSIS — O09299 Supervision of pregnancy with other poor reproductive or obstetric history, unspecified trimester: Secondary | ICD-10-CM

## 2016-09-16 DIAGNOSIS — Z3689 Encounter for other specified antenatal screening: Secondary | ICD-10-CM | POA: Diagnosis not present

## 2016-09-16 DIAGNOSIS — O99212 Obesity complicating pregnancy, second trimester: Secondary | ICD-10-CM

## 2016-09-16 DIAGNOSIS — O09292 Supervision of pregnancy with other poor reproductive or obstetric history, second trimester: Secondary | ICD-10-CM | POA: Diagnosis not present

## 2016-09-16 DIAGNOSIS — Z3A18 18 weeks gestation of pregnancy: Secondary | ICD-10-CM | POA: Diagnosis not present

## 2016-09-16 DIAGNOSIS — IMO0002 Reserved for concepts with insufficient information to code with codable children: Secondary | ICD-10-CM

## 2016-09-16 DIAGNOSIS — Z0489 Encounter for examination and observation for other specified reasons: Secondary | ICD-10-CM

## 2016-09-16 DIAGNOSIS — O0990 Supervision of high risk pregnancy, unspecified, unspecified trimester: Secondary | ICD-10-CM

## 2016-09-16 NOTE — Addendum Note (Signed)
Encounter addended by: Levonne Hubertarrie M Evangelina Delancey on: 09/16/2016 12:15 PM<BR>    Actions taken: Imaging Exam ended

## 2016-09-17 ENCOUNTER — Encounter: Payer: Self-pay | Admitting: Emergency Medicine

## 2016-09-17 ENCOUNTER — Emergency Department
Admission: EM | Admit: 2016-09-17 | Discharge: 2016-09-17 | Disposition: A | Discharge status: Home / Self Care | Payer: Medicaid Other | Attending: Emergency Medicine | Admitting: Emergency Medicine

## 2016-09-17 DIAGNOSIS — O10912 Unspecified pre-existing hypertension complicating pregnancy, second trimester: Secondary | ICD-10-CM | POA: Diagnosis present

## 2016-09-17 DIAGNOSIS — E039 Hypothyroidism, unspecified: Secondary | ICD-10-CM | POA: Diagnosis not present

## 2016-09-17 DIAGNOSIS — Z3A19 19 weeks gestation of pregnancy: Secondary | ICD-10-CM | POA: Diagnosis not present

## 2016-09-17 DIAGNOSIS — I10 Essential (primary) hypertension: Secondary | ICD-10-CM

## 2016-09-17 DIAGNOSIS — Z79899 Other long term (current) drug therapy: Secondary | ICD-10-CM | POA: Insufficient documentation

## 2016-09-17 HISTORY — DX: Phlebitis and thrombophlebitis of unspecified site: I80.9

## 2016-09-17 LAB — COMPREHENSIVE METABOLIC PANEL
ALBUMIN: 3.3 g/dL — AB (ref 3.5–5.0)
ALT: 9 U/L — ABNORMAL LOW (ref 14–54)
ANION GAP: 9 (ref 5–15)
AST: 15 U/L (ref 15–41)
Alkaline Phosphatase: 60 U/L (ref 38–126)
BILIRUBIN TOTAL: 0.3 mg/dL (ref 0.3–1.2)
BUN: 6 mg/dL (ref 6–20)
CO2: 23 mmol/L (ref 22–32)
Calcium: 9.1 mg/dL (ref 8.9–10.3)
Chloride: 103 mmol/L (ref 101–111)
Creatinine, Ser: 0.6 mg/dL (ref 0.44–1.00)
GFR calc non Af Amer: >60 mL/min (ref >60)
GLUCOSE: 93 mg/dL (ref 65–99)
POTASSIUM: 4 mmol/L (ref 3.5–5.1)
Sodium: 135 mmol/L (ref 135–145)
TOTAL PROTEIN: 7.4 g/dL (ref 6.5–8.1)

## 2016-09-17 LAB — CBC WITH DIFFERENTIAL/PLATELET
BASOS PCT: 1 %
Basophils Absolute: 0.1 10*3/uL (ref 0–0.1)
EOS ABS: 0.2 10*3/uL (ref 0–0.7)
Eosinophils Relative: 1 %
HEMATOCRIT: 36.8 % (ref 35.0–47.0)
Hemoglobin: 11.9 g/dL — ABNORMAL LOW (ref 12.0–16.0)
Lymphocytes Relative: 21 %
Lymphs Abs: 2.8 10*3/uL (ref 1.0–3.6)
MCH: 21.3 pg — AB (ref 26.0–34.0)
MCHC: 32.4 g/dL (ref 32.0–36.0)
MCV: 65.8 fL — ABNORMAL LOW (ref 80.0–100.0)
MONOS PCT: 5 %
Monocytes Absolute: 0.7 10*3/uL (ref 0.2–0.9)
NEUTROS ABS: 9.5 10*3/uL — AB (ref 1.4–6.5)
Neutrophils Relative %: 72 %
Platelets: 353 10*3/uL (ref 150–440)
RBC: 5.6 MIL/uL — ABNORMAL HIGH (ref 3.80–5.20)
RDW: 17.6 % — AB (ref 11.5–14.5)
WBC: 13.2 10*3/uL — ABNORMAL HIGH (ref 3.6–11.0)

## 2016-09-17 LAB — URINALYSIS, COMPLETE (UACMP) WITH MICROSCOPIC
BILIRUBIN URINE: NEGATIVE
GLUCOSE, UA: NEGATIVE mg/dL
Hgb urine dipstick: NEGATIVE
KETONES UR: NEGATIVE mg/dL
NITRITE: POSITIVE — AB
PH: 6 (ref 5.0–8.0)
Protein, ur: NEGATIVE mg/dL
SPECIFIC GRAVITY, URINE: 1.014 (ref 1.005–1.030)

## 2016-09-17 LAB — URIC ACID: Uric Acid, Serum: 5.3 mg/dL (ref 2.3–6.6)

## 2016-09-17 LAB — PROTIME-INR
INR: 0.95
Prothrombin Time: 12.7 seconds (ref 11.4–15.2)

## 2016-09-17 LAB — HCG, QUANTITATIVE, PREGNANCY: hCG, Beta Chain, Quant, S: 206263 m[IU]/mL — ABNORMAL HIGH (ref <5)

## 2016-09-17 MED ORDER — NITROFURANTOIN MONOHYD MACRO 100 MG PO CAPS: 100.0000 mg | ORAL_CAPSULE | Freq: Two times a day (BID) | ORAL | 0 refills | Status: AC

## 2016-09-17 NOTE — ED Triage Notes (Signed)
[facility], pt is [redacted] weeks pregnant, pt reports blood pressure 158/98,

## 2016-09-17 NOTE — ED Provider Notes (Signed)
[facility] Emergency Department Provider Note    ____________________________________________   I have reviewed the triage vital signs and the nursing notes.   HISTORY  Chief Complaint Hypertension   History limited by: Not Limited   HPI Stephanie [name] is a 31 y.o. female who presents to the emergency department today because of concerns for high blood pressure. Patient is roughly [redacted] weeks pregnant. The patient was sent over from her OB/GYN doctor's office. She states that her previous pregnancies have been complicated by hypertension. The patient states that when her blood pressure gets high she does get a slight headache.    Past Medical History:  Diagnosis Date  . Hypertension   . Hypothyroidism   . Preeclampsia   . Pulmonary edema    third trimester  . Thrombophlebitis     Patient Active Problem List   Diagnosis Date Noted  . Supervision of high risk pregnancy, antepartum 07/18/2016  . History of gestational diabetes mellitus (GDM) 07/18/2016  . Hx of preeclampsia, prior pregnancy, currently pregnant 01/02/2016  . H/O placenta previa 11/29/2015  . H/O: C-section 11/29/2015  . H/O migraine (no meds) 11/29/2015  . Obesity (BMI 30-39.9) 11/29/2015  . Rubella non-immune status, antepartum 11/29/2015  . H/O gastroesophageal reflux (GERD) 11/29/2015  . Restless leg syndrome 11/29/2015    Past Surgical History:  Procedure Laterality Date  . CESAREAN SECTION    . CESAREAN SECTION N/A 01/04/2016   Procedure: CESAREAN SECTION;  Surgeon: Silverio LaySandra [name], MD;  [facility];  Service: Obstetrics;  Laterality: N/A;    Prior to Admission medications   Medication Sig Start Date End Date Taking? Authorizing Provider  ferrous sulfate 325 (65 FE) MG tablet Take 1 tablet (325 mg total) by mouth 2 (two) times daily with a meal. Patient not taking: Reported on 07/18/2016 01/07/16   Hoover BrownsEma Kulwa, MD  Prenat w/o A FeCbnFeGlu-FA &B6 (CITRANATAL  B-CALM) 20-1 & 25 (2) MG MISC Take 3 tablets by mouth daily. Patient not taking: Reported on 07/18/2016 06/13/16   Roe Coombsachelle A Denney, CNM  Prenatal Vit-Fe Fumarate-FA (PRENATAL MULTIVITAMIN) TABS tablet Take 1 tablet by mouth daily at 12 noon.    Historical Provider, MD    Allergies Patient has no known allergies.  Family History  Problem Relation Age of Onset  . Hypertension Father   . Diabetes Maternal Grandmother     Social History Social History  Substance Use Topics  . Smoking status: Never Smoker  . Smokeless tobacco: Never Used  . Alcohol use No    Review of Systems  Constitutional: Negative for fever. Cardiovascular: Negative for chest pain. Respiratory: Negative for shortness of breath. Gastrointestinal: Negative for abdominal pain, vomiting and diarrhea. Genitourinary: Negative for dysuria. Musculoskeletal: Negative for back pain. Skin: Positive for phlebitis of the right leg Neurological: Positive for headache  10-point ROS otherwise negative.  ____________________________________________   PHYSICAL EXAM:  VITAL SIGNS: ED Triage Vitals  Enc Vitals Group     BP 09/17/16 1323 132/90     Pulse Rate 09/17/16 1323 68     Resp 09/17/16 1323 16     Temp 09/17/16 1323 97.8 F (36.6 C)     Temp Source 09/17/16 1323 Oral     SpO2 09/17/16 1323 99 %     Weight 09/17/16 1324 250 lb (113.4 kg)     Height 09/17/16 1324 5\' 6"  (1.676 m)     Head Circumference --      Peak Flow --  Pain Score 09/17/16 1324 2     Pain Loc --      Pain Edu? --      Excl. in GC? --      Constitutional: Alert and oriented. Well appearing and in no distress. Eyes: Conjunctivae are normal. Normal extraocular movements. ENT   Head: Normocephalic and atraumatic.   Nose: No congestion/rhinnorhea.   Mouth/Throat: Mucous membranes are moist.   Neck: No stridor. Hematological/Lymphatic/Immunilogical: No cervical lymphadenopathy. Cardiovascular: Normal rate, regular  rhythm.  No murmurs, rubs, or gallops.  Respiratory: Normal respiratory effort without tachypnea nor retractions. Breath sounds are clear and equal bilaterally. No wheezes/rales/rhonchi. Gastrointestinal: Soft and non tender. Gravid. No rebound. No guarding.  Genitourinary: Deferred Musculoskeletal: Normal range of motion in all extremities. No palpable cords of the lower extremity appreciated. Neurologic:  Normal speech and language. No gross focal neurologic deficits are appreciated.  Skin:  Skin is warm, dry and intact. No rash noted. Psychiatric: Mood and affect are normal. Speech and behavior are normal. Patient exhibits appropriate insight and judgment.  ____________________________________________    LABS (pertinent positives/negatives)  Labs Reviewed  CBC WITH DIFFERENTIAL/PLATELET - Abnormal; Notable for the following:       Result Value   WBC 13.2 (*)    RBC 5.60 (*)    Hemoglobin 11.9 (*)    MCV 65.8 (*)    MCH 21.3 (*)    RDW 17.6 (*)    Neutro Abs 9.5 (*)    All other components within normal limits  COMPREHENSIVE METABOLIC PANEL - Abnormal; Notable for the following:    Albumin 3.3 (*)    ALT 9 (*)    All other components within normal limits  URINALYSIS, COMPLETE (UACMP) WITH MICROSCOPIC - Abnormal; Notable for the following:    Color, Urine YELLOW (*)    APPearance HAZY (*)    Nitrite POSITIVE (*)    Leukocytes, UA MODERATE (*)    Bacteria, UA RARE (*)    Squamous Epithelial / LPF 6-30 (*)    All other components within normal limits  HCG, QUANTITATIVE, PREGNANCY - Abnormal; Notable for the following:    hCG, Beta Chain, Quant, S 206,263 (*)    All other components within normal limits  PROTIME-INR  URIC ACID     ____________________________________________   EKG  None  ____________________________________________     RADIOLOGY  None  ____________________________________________   PROCEDURES  Procedures  ____________________________________________   INITIAL IMPRESSION / ASSESSMENT AND PLAN / ED COURSE  Pertinent labs & imaging results that were available during my care of the patient were reviewed by me and considered in my medical decision making (see chart for details).  Patient presented to the emergency department today because of concerns for hypertension. Blood work here without any concerning findings. Patient's urine with nitrate however no dysuria. Discussed hypertension with [facility] on call physician. They did not recommend starting hypertensive agents at this time.  ____________________________________________   FINAL CLINICAL IMPRESSION(S) / ED DIAGNOSES  Final diagnoses:  Hypertension, unspecified type     Note: This dictation was prepared with Dragon dictation. Any transcriptional errors that result from this process are unintentional     Phineas Semen, MD 09/17/16 [phone]

## 2016-09-17 NOTE — Discharge Instructions (Signed)
Please seek medical attention for any high fevers, chest pain, shortness of breath, change in behavior, persistent vomiting, bloody stool or any other new or concerning symptoms.  

## 2016-09-17 NOTE — ED Notes (Signed)
AAOx3.  Skin warm and dry.  Ambulates with easy and steady gait. MAE equally and strong.  NAD 

## 2016-09-17 NOTE — ED Notes (Signed)
AAOx3.  Skin warm and dry.  STates she has current history of phelbitis to right lower leg / knee area.

## 2016-09-24 ENCOUNTER — Encounter: Payer: Self-pay | Admitting: *Deleted

## 2016-09-24 ENCOUNTER — Ambulatory Visit (INDEPENDENT_AMBULATORY_CARE_PROVIDER_SITE_OTHER): Payer: Medicaid Other | Admitting: Obstetrics and Gynecology

## 2016-09-24 VITALS — BP 142/99 | HR 87 | Wt 251.0 lb

## 2016-09-24 DIAGNOSIS — Z283 Underimmunization status: Secondary | ICD-10-CM | POA: Diagnosis not present

## 2016-09-24 DIAGNOSIS — O09299 Supervision of pregnancy with other poor reproductive or obstetric history, unspecified trimester: Secondary | ICD-10-CM

## 2016-09-24 DIAGNOSIS — O10919 Unspecified pre-existing hypertension complicating pregnancy, unspecified trimester: Secondary | ICD-10-CM

## 2016-09-24 DIAGNOSIS — O10912 Unspecified pre-existing hypertension complicating pregnancy, second trimester: Secondary | ICD-10-CM

## 2016-09-24 DIAGNOSIS — O09292 Supervision of pregnancy with other poor reproductive or obstetric history, second trimester: Secondary | ICD-10-CM | POA: Diagnosis not present

## 2016-09-24 DIAGNOSIS — O0990 Supervision of high risk pregnancy, unspecified, unspecified trimester: Secondary | ICD-10-CM

## 2016-09-24 DIAGNOSIS — O09899 Supervision of other high risk pregnancies, unspecified trimester: Secondary | ICD-10-CM

## 2016-09-24 DIAGNOSIS — Z8632 Personal history of gestational diabetes: Secondary | ICD-10-CM

## 2016-09-24 DIAGNOSIS — O34219 Maternal care for unspecified type scar from previous cesarean delivery: Secondary | ICD-10-CM

## 2016-09-24 DIAGNOSIS — Z3490 Encounter for supervision of normal pregnancy, unspecified, unspecified trimester: Secondary | ICD-10-CM

## 2016-09-24 DIAGNOSIS — Z98891 History of uterine scar from previous surgery: Secondary | ICD-10-CM

## 2016-09-24 DIAGNOSIS — O9989 Other specified diseases and conditions complicating pregnancy, childbirth and the puerperium: Secondary | ICD-10-CM

## 2016-09-24 MED ORDER — LABETALOL HCL 200 MG PO TABS: 200.0000 mg | ORAL_TABLET | Freq: Two times a day (BID) | ORAL | 3 refills | Status: DC

## 2016-09-24 NOTE — Progress Notes (Signed)
   PRENATAL VISIT NOTE  Subjective:  Stephanie [name] is a 31 y.o. [phone] at 6559w4d being seen today for ongoing prenatal care.  She is currently monitored for the following issues for this high-risk pregnancy and has H/O placenta previa; H/O: C-section; H/O migraine (no meds); Obesity (BMI 30-39.9); Rubella non-immune status, antepartum; H/O gastroesophageal reflux (GERD); Restless leg syndrome; Hx of preeclampsia, prior pregnancy, currently pregnant; Supervision of high risk pregnancy, antepartum; and History of gestational diabetes mellitus (GDM) on her problem list.  Patient reports feeling dizzy, lightheaded and occasional headaches.  Contractions: Not present. Vag. Bleeding: None.  Movement: Present. Denies leaking of fluid.   The following portions of the patient's history were reviewed and updated as appropriate: allergies, current medications, past family history, past medical history, past social history, past surgical history and problem list. Problem list updated.  Objective:   Vitals:   09/24/16 1346  BP: (!) 142/99  Pulse: 87  Weight: 251 lb (113.9 kg)    Fetal Status: Fetal Heart Rate (bpm): 140   Movement: Present     General:  Alert, oriented and cooperative. Patient is in no acute distress.  Skin: Skin is warm and dry. No rash noted.   Cardiovascular: Normal heart rate noted  Respiratory: Normal respiratory effort, no problems with respiration noted  Abdomen: Soft, gravid, appropriate for gestational age. Pain/Pressure: Absent     Pelvic:  Cervical exam deferred        Extremities: Normal range of motion.  Edema: Trace  Mental Status: Normal mood and affect. Normal behavior. Normal judgment and thought content.   Assessment and Plan:  Pregnancy: [id number] at 6459w4d  1. Supervision of high risk pregnancy, antepartum Patient was seen in ED on 2/21 with normal labs but BP elevated 131/90. BP today 142/99 [facility] will start labetalol 200 mg  BID Continue daily ASA Patient was not aware of UTI on 2/21. UA positive nitrite today- Rx Keflex provided  2. Rubella non-immune status, antepartum Patient is doing well without complaints Reviewed ultrasound results. F/U anatomy scheduled on 3/20  3. H/O: C-section Will be scheduled for repeat c-section Patient also desires BTL- will have her sign forms at a later visit  4. History of gestational diabetes mellitus (GDM) Nl A1c F/u third trimester glucola  5. Hx of preeclampsia, prior pregnancy, currently pregnant Continue daily ASA  General obstetric precautions including but not limited to vaginal bleeding, contractions, leaking of fluid and fetal movement were reviewed in detail with the patient. Please refer to After Visit Summary for other counseling recommendations.  Return in about 4 weeks (around 10/22/2016) for ROB.   Catalina AntiguaPeggy Dijon Kohlman, MD

## 2016-09-24 NOTE — Progress Notes (Signed)
Positive Nitrates

## 2016-09-24 NOTE — Progress Notes (Signed)
Patient present for OB visit complains of headaches, dizziness, seeing black spots/auroa, nausea.  She went to [facility] ER last Wednesday, but was not given any Rx.

## 2016-09-25 ENCOUNTER — Telehealth: Payer: Self-pay

## 2016-09-25 DIAGNOSIS — O2342 Unspecified infection of urinary tract in pregnancy, second trimester: Secondary | ICD-10-CM

## 2016-09-25 LAB — PROTEIN / CREATININE RATIO, URINE
Creatinine, Urine: 221 mg/dL
PROTEIN UR: 50.1 mg/dL
PROTEIN/CREAT RATIO: 227 mg/g{creat} — AB (ref 0–200)

## 2016-09-25 MED ORDER — CEPHALEXIN 500 MG PO CAPS: 500.0000 mg | ORAL_CAPSULE | Freq: Three times a day (TID) | ORAL | 0 refills | Status: DC

## 2016-09-25 NOTE — Telephone Encounter (Signed)
Patient called and left vm saying that when she was in office yesterday BP med and Keflex was prescribed but keflex was not sent to pharmacy. Sent Keflex with provider approval, called patient, left vm.

## 2016-09-26 ENCOUNTER — Inpatient Hospital Stay (HOSPITAL_COMMUNITY)
Admission: AD | Admit: 2016-09-26 | Discharge: 2016-09-26 | Disposition: A | Discharge status: Home / Self Care | Payer: Medicaid Other | Source: Ambulatory Visit | Attending: Obstetrics and Gynecology | Admitting: Obstetrics and Gynecology

## 2016-09-26 ENCOUNTER — Encounter (HOSPITAL_COMMUNITY): Payer: Self-pay

## 2016-09-26 ENCOUNTER — Telehealth: Payer: Self-pay

## 2016-09-26 DIAGNOSIS — R42 Dizziness and giddiness: Secondary | ICD-10-CM | POA: Insufficient documentation

## 2016-09-26 DIAGNOSIS — O99282 Endocrine, nutritional and metabolic diseases complicating pregnancy, second trimester: Secondary | ICD-10-CM | POA: Insufficient documentation

## 2016-09-26 DIAGNOSIS — Z3A19 19 weeks gestation of pregnancy: Secondary | ICD-10-CM | POA: Diagnosis not present

## 2016-09-26 DIAGNOSIS — G43809 Other migraine, not intractable, without status migrainosus: Secondary | ICD-10-CM

## 2016-09-26 DIAGNOSIS — O99352 Diseases of the nervous system complicating pregnancy, second trimester: Secondary | ICD-10-CM | POA: Insufficient documentation

## 2016-09-26 DIAGNOSIS — G43909 Migraine, unspecified, not intractable, without status migrainosus: Secondary | ICD-10-CM | POA: Diagnosis not present

## 2016-09-26 DIAGNOSIS — O26892 Other specified pregnancy related conditions, second trimester: Secondary | ICD-10-CM | POA: Diagnosis present

## 2016-09-26 DIAGNOSIS — E039 Hypothyroidism, unspecified: Secondary | ICD-10-CM | POA: Insufficient documentation

## 2016-09-26 DIAGNOSIS — O162 Unspecified maternal hypertension, second trimester: Secondary | ICD-10-CM | POA: Insufficient documentation

## 2016-09-26 DIAGNOSIS — O210 Mild hyperemesis gravidarum: Secondary | ICD-10-CM | POA: Insufficient documentation

## 2016-09-26 DIAGNOSIS — Z7982 Long term (current) use of aspirin: Secondary | ICD-10-CM | POA: Insufficient documentation

## 2016-09-26 DIAGNOSIS — O9989 Other specified diseases and conditions complicating pregnancy, childbirth and the puerperium: Secondary | ICD-10-CM | POA: Diagnosis present

## 2016-09-26 DIAGNOSIS — O10919 Unspecified pre-existing hypertension complicating pregnancy, unspecified trimester: Secondary | ICD-10-CM

## 2016-09-26 DIAGNOSIS — R51 Headache: Secondary | ICD-10-CM | POA: Diagnosis present

## 2016-09-26 LAB — COMPREHENSIVE METABOLIC PANEL
ALBUMIN: 3 g/dL — AB (ref 3.5–5.0)
ALT: 9 U/L — ABNORMAL LOW (ref 14–54)
AST: 15 U/L (ref 15–41)
Alkaline Phosphatase: 55 U/L (ref 38–126)
Anion gap: 9 (ref 5–15)
BUN: 6 mg/dL (ref 6–20)
CHLORIDE: 106 mmol/L (ref 101–111)
CO2: 21 mmol/L — ABNORMAL LOW (ref 22–32)
Calcium: 9 mg/dL (ref 8.9–10.3)
Creatinine, Ser: 0.52 mg/dL (ref 0.44–1.00)
GFR calc Af Amer: >60 mL/min (ref >60)
GFR calc non Af Amer: >60 mL/min (ref >60)
GLUCOSE: 94 mg/dL (ref 65–99)
POTASSIUM: 3.9 mmol/L (ref 3.5–5.1)
Sodium: 136 mmol/L (ref 135–145)
Total Bilirubin: 0.3 mg/dL (ref 0.3–1.2)
Total Protein: 7 g/dL (ref 6.5–8.1)

## 2016-09-26 LAB — CULTURE, OB URINE

## 2016-09-26 LAB — PROTEIN / CREATININE RATIO, URINE
CREATININE, URINE: 109 mg/dL
Protein Creatinine Ratio: 0.15 mg/mg{Cre} (ref 0.00–0.15)
Total Protein, Urine: 16 mg/dL

## 2016-09-26 LAB — CBC
HCT: 35.8 % — ABNORMAL LOW (ref 36.0–46.0)
Hemoglobin: 11.2 g/dL — ABNORMAL LOW (ref 12.0–15.0)
MCH: 21.3 pg — ABNORMAL LOW (ref 26.0–34.0)
MCHC: 31.3 g/dL (ref 30.0–36.0)
MCV: 68.2 fL — ABNORMAL LOW (ref 78.0–100.0)
PLATELETS: 370 10*3/uL (ref 150–400)
RBC: 5.25 MIL/uL — ABNORMAL HIGH (ref 3.87–5.11)
RDW: 17 % — AB (ref 11.5–15.5)
WBC: 11.2 10*3/uL — AB (ref 4.0–10.5)

## 2016-09-26 LAB — URINE CULTURE, OB REFLEX

## 2016-09-26 MED ORDER — BUTALBITAL-APAP-CAFFEINE 50-325-40 MG PO TABS
2.0000 | ORAL_TABLET | Freq: Once | ORAL | Status: AC
  Administered 2016-09-26: 2 via ORAL
  Filled 2016-09-26: qty 2

## 2016-09-26 MED ORDER — CYCLOBENZAPRINE HCL 10 MG PO TABS: 10.0000 mg | ORAL_TABLET | Freq: Two times a day (BID) | ORAL | 0 refills | Status: DC | PRN

## 2016-09-26 MED ORDER — CYCLOBENZAPRINE HCL 10 MG PO TABS
10.0000 mg | ORAL_TABLET | Freq: Once | ORAL | Status: AC
  Administered 2016-09-26: 10 mg via ORAL
  Filled 2016-09-26: qty 1

## 2016-09-26 MED ORDER — BUTALBITAL-APAP-CAFFEINE 50-325-40 MG PO CAPS: 1.0000 | ORAL_CAPSULE | Freq: Four times a day (QID) | ORAL | 3 refills | Status: DC | PRN

## 2016-09-26 NOTE — MAU Provider Note (Signed)
History     CSN: [id number]  Arrival date and time: 09/26/16 1234   None     Chief Complaint  Patient presents with  . Headache  . Dizziness  . Nausea   HPI 31 yo [id number] at [redacted]w[redacted]d presenting today for the evaluation of severe headache. Patient reports worsening headache over the past 2 days. She has not taken tylenol but has increased her hydration. Patient recently started on labetalol for new diagnosis of CHTN in pregnancy. Patient reports some visual changes but denies RUQ/epigastric pain. She reports a remote history of migraine headaches treated with Topamax. Patient denies any abdominal pain, vaginal bleeding or leakage of fluid. She reports the presence of fetal movement   Past Medical History:  Diagnosis Date  . Hypertension   . Hypothyroidism   . Preeclampsia   . Pulmonary edema    third trimester  . Thrombophlebitis     Past Surgical History:  Procedure Laterality Date  . CESAREAN SECTION    . CESAREAN SECTION N/A 01/04/2016   Procedure: CESAREAN SECTION;  Surgeon: Silverio Lay, MD;  Location: [facility] BIRTHING SU[facility];  Service: Obstetrics;  Laterality: N/A;    Family History  Problem Relation Age of Onset  . Hypertension Father   . Diabetes Maternal Grandmother     Social History  Substance Use Topics  . Smoking status: Never Smoker  . Smokeless tobacco: Never Used  . Alcohol use No    Allergies: No Known Allergies  Prescriptions Prior to Admission  Medication Sig Dispense Refill Last Dose  . acetaminophen (TYLENOL) 325 MG tablet Take 325 mg by mouth every 6 (six) hours as needed for headache.   09/26/2016 at Unknown time  . aspirin EC 81 MG tablet Take 81 mg by mouth daily.   Past Week at Unknown time  . cephALEXin (KEFLEX) 500 MG capsule Take 1 capsule (500 mg total) by mouth 3 (three) times daily. 21 capsule 0 09/25/2016 at Unknown time  . labetalol (NORMODYNE) 200 MG tablet Take 1 tablet (200 mg total) by mouth 2 (two) times daily. 60 tablet 3 09/25/2016 at  Unknown time  . Prenatal Vit-Fe Fumarate-FA (PRENATAL MULTIVITAMIN) TABS tablet Take 1 tablet by mouth daily at 12 noon.   09/25/2016 at Unknown time  . ferrous sulfate 325 (65 FE) MG tablet Take 1 tablet (325 mg total) by mouth 2 (two) times daily with a meal. (Patient not taking: Reported on 07/18/2016) 60 tablet 3 Not Taking  . Prenat w/o A FeCbnFeGlu-FA &B6 (CITRANATAL B-CALM) 20-1 & 25 (2) MG MISC Take 3 tablets by mouth daily. (Patient not taking: Reported on 07/18/2016) 90 each 11 Not Taking    Review of Systems  See pertinent in HPI Physical Exam   Blood pressure 130/73, pulse 68, temperature 98.6 F (37 C), temperature source Oral, resp. rate 18, weight 251 lb 12 oz (114.2 kg), last menstrual period 05/06/2016, SpO2 99 %, unknown if currently breastfeeding.  Physical Exam  GENERAL: Well-developed, well-nourished female in no acute distress.  ABDOMEN: Soft, nontender,gravid PELVIC: Not indicated EXTREMITIES: No cyanosis, clubbing, or edema, 2+ distal pulses.   MAU Course  Procedures  MDM CBC    Component Value Date/Time   WBC 11.2 (H) 09/26/2016 1314   RBC 5.25 (H) 09/26/2016 1314   HGB 11.2 (L) 09/26/2016 1314   HCT 35.8 (L) 09/26/2016 1314   HCT 37.2 07/18/2016 1148   PLT 370 09/26/2016 1314   PLT 424 (H) 07/18/2016 1148   MCV 68.2 (L) 09/26/2016 1314  MCV 66 (L) 07/18/2016 1148   MCH 21.3 (L) 09/26/2016 1314   MCHC 31.3 09/26/2016 1314   RDW 17.0 (H) 09/26/2016 1314   RDW 17.5 (H) 07/18/2016 1148   LYMPHSABS 2.8 09/17/2016 1340   LYMPHSABS 2.9 07/18/2016 1148   MONOABS 0.7 09/17/2016 1340   EOSABS 0.2 09/17/2016 1340   EOSABS 0.1 07/18/2016 1148   BASOSABS 0.1 09/17/2016 1340   BASOSABS 0.0 07/18/2016 1148   CMP     Component Value Date/Time   NA 136 09/26/2016 1314   K 3.9 09/26/2016 1314   CL 106 09/26/2016 1314   CO2 21 (L) 09/26/2016 1314   GLUCOSE 94 09/26/2016 1314   GLUCOSE 128 01/02/2016 0510   BUN 6 09/26/2016 1314   CREATININE 0.52  09/26/2016 1314   CALCIUM 9.0 09/26/2016 1314   PROT 7.0 09/26/2016 1314   ALBUMIN 3.0 (L) 09/26/2016 1314   AST 15 09/26/2016 1314   ALT 9 (L) 09/26/2016 1314   ALKPHOS 55 09/26/2016 1314   BILITOT 0.3 09/26/2016 1314   GFRNONAA >60 09/26/2016 1314   GFRAA >60 09/26/2016 1314   Fioricet and Flexeril given with improvement in symptoms  Assessment and Plan  31 yo [id number] at 6458w6d with migraine headaches - Reassurance provided - Rx fioricet and flexeril provided - Discussed referral to headache specialist if symptoms worsen while on current treatment - Precautions reviewed - RTC prn - Follow up as scheduled for routine prenatal care  [place] Callas 09/26/2016, 3:29 PM

## 2016-09-26 NOTE — Telephone Encounter (Signed)
Patient called and stated that she has a severe headache that is not relived by tylenol, she reports dizziness, lightheaded when standing. Advised patient to go to hospital.

## 2016-09-26 NOTE — MAU Note (Signed)
Saw her dr 2 days ago.  Was put on BP meds.  Last night about 6 p.m. A severe HA came on. Has been having headaches, but this one is worse.  It is pounding and getting no relief with Tylenol.  Is worried it is associated with her BP.  Feeling dizzy and nauseous. Hx of 2 PTD/ pre- eclampsia

## 2016-09-26 NOTE — Discharge Instructions (Signed)

## 2016-10-14 ENCOUNTER — Ambulatory Visit (HOSPITAL_COMMUNITY)
Admission: RE | Admit: 2016-10-14 | Discharge: 2016-10-14 | Disposition: A | Discharge status: Home / Self Care | Payer: Medicaid Other | Source: Ambulatory Visit | Attending: Obstetrics and Gynecology | Admitting: Obstetrics and Gynecology

## 2016-10-14 ENCOUNTER — Other Ambulatory Visit (HOSPITAL_COMMUNITY): Payer: Self-pay | Admitting: *Deleted

## 2016-10-14 ENCOUNTER — Encounter (HOSPITAL_COMMUNITY): Payer: Self-pay

## 2016-10-14 DIAGNOSIS — O34211 Maternal care for low transverse scar from previous cesarean delivery: Secondary | ICD-10-CM | POA: Diagnosis not present

## 2016-10-14 DIAGNOSIS — O10012 Pre-existing essential hypertension complicating pregnancy, second trimester: Secondary | ICD-10-CM | POA: Diagnosis not present

## 2016-10-14 DIAGNOSIS — O10919 Unspecified pre-existing hypertension complicating pregnancy, unspecified trimester: Secondary | ICD-10-CM

## 2016-10-14 DIAGNOSIS — O99212 Obesity complicating pregnancy, second trimester: Secondary | ICD-10-CM | POA: Diagnosis not present

## 2016-10-14 DIAGNOSIS — Z3A22 22 weeks gestation of pregnancy: Secondary | ICD-10-CM | POA: Insufficient documentation

## 2016-10-14 DIAGNOSIS — Z362 Encounter for other antenatal screening follow-up: Secondary | ICD-10-CM | POA: Diagnosis not present

## 2016-10-14 DIAGNOSIS — O09292 Supervision of pregnancy with other poor reproductive or obstetric history, second trimester: Secondary | ICD-10-CM | POA: Insufficient documentation

## 2016-10-14 DIAGNOSIS — Z0489 Encounter for examination and observation for other specified reasons: Secondary | ICD-10-CM

## 2016-10-14 DIAGNOSIS — IMO0002 Reserved for concepts with insufficient information to code with codable children: Secondary | ICD-10-CM

## 2016-10-22 ENCOUNTER — Inpatient Hospital Stay (HOSPITAL_COMMUNITY)
Admission: AD | Admit: 2016-10-22 | Discharge: 2016-10-22 | Disposition: A | Discharge status: Home / Self Care | Payer: Medicaid Other | Source: Ambulatory Visit | Attending: Obstetrics & Gynecology | Admitting: Obstetrics & Gynecology

## 2016-10-22 ENCOUNTER — Encounter (HOSPITAL_COMMUNITY): Payer: Self-pay | Admitting: *Deleted

## 2016-10-22 ENCOUNTER — Ambulatory Visit (INDEPENDENT_AMBULATORY_CARE_PROVIDER_SITE_OTHER): Payer: Medicaid Other | Admitting: Obstetrics and Gynecology

## 2016-10-22 VITALS — BP 130/80 | HR 66 | Wt 262.0 lb

## 2016-10-22 DIAGNOSIS — O0990 Supervision of high risk pregnancy, unspecified, unspecified trimester: Secondary | ICD-10-CM

## 2016-10-22 DIAGNOSIS — O09299 Supervision of pregnancy with other poor reproductive or obstetric history, unspecified trimester: Secondary | ICD-10-CM

## 2016-10-22 DIAGNOSIS — O09292 Supervision of pregnancy with other poor reproductive or obstetric history, second trimester: Secondary | ICD-10-CM

## 2016-10-22 DIAGNOSIS — Z3A23 23 weeks gestation of pregnancy: Secondary | ICD-10-CM | POA: Diagnosis not present

## 2016-10-22 DIAGNOSIS — R51 Headache: Secondary | ICD-10-CM

## 2016-10-22 DIAGNOSIS — G43909 Migraine, unspecified, not intractable, without status migrainosus: Secondary | ICD-10-CM | POA: Diagnosis not present

## 2016-10-22 DIAGNOSIS — O10912 Unspecified pre-existing hypertension complicating pregnancy, second trimester: Secondary | ICD-10-CM

## 2016-10-22 DIAGNOSIS — O10012 Pre-existing essential hypertension complicating pregnancy, second trimester: Secondary | ICD-10-CM | POA: Diagnosis not present

## 2016-10-22 DIAGNOSIS — O10919 Unspecified pre-existing hypertension complicating pregnancy, unspecified trimester: Secondary | ICD-10-CM

## 2016-10-22 DIAGNOSIS — O26893 Other specified pregnancy related conditions, third trimester: Secondary | ICD-10-CM

## 2016-10-22 DIAGNOSIS — R519 Headache, unspecified: Secondary | ICD-10-CM

## 2016-10-22 LAB — COMPREHENSIVE METABOLIC PANEL
ALK PHOS: 62 U/L (ref 38–126)
ALT: 10 U/L — AB (ref 14–54)
AST: 14 U/L — ABNORMAL LOW (ref 15–41)
Albumin: 3.1 g/dL — ABNORMAL LOW (ref 3.5–5.0)
Anion gap: 10 (ref 5–15)
BILIRUBIN TOTAL: 0.2 mg/dL — AB (ref 0.3–1.2)
BUN: 8 mg/dL (ref 6–20)
CALCIUM: 8.7 mg/dL — AB (ref 8.9–10.3)
CO2: 21 mmol/L — ABNORMAL LOW (ref 22–32)
CREATININE: 0.64 mg/dL (ref 0.44–1.00)
Chloride: 104 mmol/L (ref 101–111)
Glucose, Bld: 84 mg/dL (ref 65–99)
Potassium: 3.8 mmol/L (ref 3.5–5.1)
Sodium: 135 mmol/L (ref 135–145)
TOTAL PROTEIN: 7.8 g/dL (ref 6.5–8.1)

## 2016-10-22 LAB — CBC
HEMATOCRIT: 37.4 % (ref 36.0–46.0)
HEMOGLOBIN: 11.5 g/dL — AB (ref 12.0–15.0)
MCH: 20.8 pg — AB (ref 26.0–34.0)
MCHC: 30.7 g/dL (ref 30.0–36.0)
MCV: 67.5 fL — AB (ref 78.0–100.0)
Platelets: 387 10*3/uL (ref 150–400)
RBC: 5.54 MIL/uL — AB (ref 3.87–5.11)
RDW: 16.4 % — ABNORMAL HIGH (ref 11.5–15.5)
WBC: 13.6 10*3/uL — ABNORMAL HIGH (ref 4.0–10.5)

## 2016-10-22 LAB — URINALYSIS, ROUTINE W REFLEX MICROSCOPIC
BILIRUBIN URINE: NEGATIVE
Glucose, UA: NEGATIVE mg/dL
Hgb urine dipstick: NEGATIVE
Ketones, ur: NEGATIVE mg/dL
LEUKOCYTES UA: NEGATIVE
NITRITE: NEGATIVE
Protein, ur: NEGATIVE mg/dL
SPECIFIC GRAVITY, URINE: 1.015 (ref 1.005–1.030)
pH: 6.5 (ref 5.0–8.0)

## 2016-10-22 LAB — PROTEIN / CREATININE RATIO, URINE
Creatinine, Urine: 122 mg/dL
Protein Creatinine Ratio: 0.17 mg/mg{Cre} — ABNORMAL HIGH (ref 0.00–0.15)
TOTAL PROTEIN, URINE: 21 mg/dL

## 2016-10-22 MED ORDER — LABETALOL HCL 200 MG PO TABS: 200.0000 mg | ORAL_TABLET | Freq: Three times a day (TID) | ORAL | 3 refills | Status: DC

## 2016-10-22 MED ORDER — CYCLOBENZAPRINE HCL 10 MG PO TABS: 10.0000 mg | ORAL_TABLET | Freq: Three times a day (TID) | ORAL | 1 refills | Status: DC | PRN

## 2016-10-22 MED ORDER — CYCLOBENZAPRINE HCL 10 MG PO TABS
10.0000 mg | ORAL_TABLET | Freq: Once | ORAL | Status: AC
  Administered 2016-10-22: 10 mg via ORAL
  Filled 2016-10-22: qty 1

## 2016-10-22 MED ORDER — METOCLOPRAMIDE HCL 10 MG PO TABS: 10.0000 mg | ORAL_TABLET | Freq: Four times a day (QID) | ORAL | 1 refills | Status: DC | PRN

## 2016-10-22 MED ORDER — IBUPROFEN 600 MG PO TABS
600.0000 mg | ORAL_TABLET | Freq: Once | ORAL | Status: AC
  Administered 2016-10-22: 600 mg via ORAL
  Filled 2016-10-22: qty 1

## 2016-10-22 MED ORDER — METOCLOPRAMIDE HCL 10 MG PO TABS
10.0000 mg | ORAL_TABLET | Freq: Once | ORAL | Status: AC
  Administered 2016-10-22: 10 mg via ORAL
  Filled 2016-10-22: qty 1

## 2016-10-22 NOTE — MAU Note (Signed)
P sent from office for elevated b/ps. C/O headache and nausea. On labetalol

## 2016-10-22 NOTE — Progress Notes (Signed)
Patient complains of having a headache, nausea, auroa and dizziness. She took her Bp meds this morning.

## 2016-10-22 NOTE — MAU Provider Note (Signed)
Chief Complaint:  Hypertension   First Provider Initiated Contact with Patient 10/22/16 1325      HPI: Stephanie [name] is a 31 y.o. [id number] at 8280w4d [facility] [place] with hx preeclampsia with preterm delivery by C/S in 2 prior pregnancies (at 4028 and 31 weeks) who presents to maternity admissions reporting h/a x 3-4 weeks unresolved by Fioricet. She was started on labetalol 200 mg BID during this pregnancy since HTN developed prior to [redacted] weeks gestation and has taken her labetalol as prescribed today. She has not tried any treatment today for h/a because she reports that Tylenol and Fioricet have not worked. Flexeril works better for her headaches but it is difficult to take at home because of sedation.  Her h/a today is described as constant frontal, associated with nausea and light sensitivity.  She has not eaten anything today.  She is worried that these symptoms resemble her previous pregnancies with preeclampsia. She denies shortness of breath. She reports good fetal movement, denies abdominal pain, LOF, vaginal bleeding, vaginal itching/burning, urinary symptoms, h/a, dizziness, n/v, or fever/chills.    HPI  Past Medical History: Past Medical History:  Diagnosis Date  . Hypertension   . Hypothyroidism   . Preeclampsia   . Pulmonary edema    third trimester  . Thrombophlebitis     Past obstetric history: OB History  Gravida Para Term Preterm AB Living  4 3 1 2   3   SAB TAB Ectopic Multiple Live Births        0 3    # Outcome Date GA Lbr Len/2nd Weight Sex Delivery Anes PTL Lv  4 Current           3 Preterm 01/04/16 1049w3d  3 lb 8.4 oz (1.6 kg) M CS-LTranv Spinal  LIV     Complications: Pre-eclampsia,Acute pulmonary edema  2 Term 06/09/09 2062w0d  6 lb 13 oz (3.09 kg) M CS-LTranv   LIV  1 Preterm 07/06/08 3239w0d  3 lb 4 oz (1.474 kg) F CS-LTranv  Y LIV     Complications: Acute pulmonary edema,Pre-eclampsia      Past Surgical History: Past Surgical History:  Procedure Laterality  Date  . CESAREAN SECTION    . CESAREAN SECTION N/A 01/04/2016   Procedure: CESAREAN SECTION;  Surgeon: Silverio LaySandra [name], MD;  [facility];  Service: Obstetrics;  Laterality: N/A;    Family History: Family History  Problem Relation Age of Onset  . Hypertension Father   . Diabetes Maternal Grandmother     Social History: Social History  Substance Use Topics  . Smoking status: Never Smoker  . Smokeless tobacco: Never Used  . Alcohol use No    Allergies: No Known Allergies  Meds:  Prescriptions Prior to Admission  Medication Sig Dispense Refill Last Dose  . acetaminophen (TYLENOL) 325 MG tablet Take 325 mg by mouth every 6 (six) hours as needed for headache.   Past Month at Unknown time  . aspirin EC 81 MG tablet Take 81 mg by mouth daily.   10/21/2016 at Unknown time  . Butalbital-APAP-Caffeine 50-325-40 MG capsule Take 1-2 capsules by mouth every 6 (six) hours as needed for headache. 30 capsule 3 Past Month at Unknown time  . cephALEXin (KEFLEX) 500 MG capsule Take 1 capsule (500 mg total) by mouth 3 (three) times daily. 21 capsule 0 Past Week at Unknown time  . Prenatal Vit-Fe Fumarate-FA (PRENATAL MULTIVITAMIN) TABS tablet Take 1 tablet by mouth daily at 12 noon.  10/22/2016 at Unknown time  . [DISCONTINUED] cyclobenzaprine (FLEXERIL) 10 MG tablet Take 1 tablet (10 mg total) by mouth 2 (two) times daily as needed for muscle spasms. 20 tablet 0 Past Month at Unknown time  . [DISCONTINUED] labetalol (NORMODYNE) 200 MG tablet Take 1 tablet (200 mg total) by mouth 2 (two) times daily. 60 tablet 3 10/22/2016 at 0800    ROS:  Review of Systems  Constitutional: Negative for chills, fatigue and fever.  Eyes: Negative for visual disturbance.  Respiratory: Negative for shortness of breath.   Cardiovascular: Negative for chest pain.  Gastrointestinal: Positive for nausea. Negative for abdominal pain and vomiting.  Genitourinary: Negative for difficulty urinating, dysuria,  flank pain, pelvic pain, vaginal bleeding, vaginal discharge and vaginal pain.  Neurological: Positive for headaches. Negative for dizziness.  Psychiatric/Behavioral: Negative.      I have reviewed patient's Past Medical Hx, Surgical Hx, Family Hx, Social Hx, medications and allergies.   Physical Exam   Patient Vitals for the past 24 hrs:  BP Temp Temp src Pulse Resp Height Weight  10/22/16 1708 119/71 98.4 F (36.9 C) Oral 63 18 - -  10/22/16 1601 136/89 - - 69 - - -  10/22/16 1547 (!) 146/89 - - 69 - - -  10/22/16 1532 (!) 151/94 - - 71 - - -  10/22/16 1516 (!) 156/88 - - 62 - - -  10/22/16 1500 (!) 151/83 - - (!) 58 - - -  10/22/16 1450 113/61 - - 60 - - -  10/22/16 1445 (!) 154/92 - - 64 - - -  10/22/16 1430 (!) 136/92 - - 69 18 - -  10/22/16 1416 139/89 - - 76 - - -  10/22/16 1410 140/88 - - 74 18 - -  10/22/16 1258 (!) 149/97 99.4 F (37.4 C) - 86 18 5\' 6"  (1.676 m) 262 lb (118.8 kg)   Constitutional: Well-developed, well-nourished female in no acute distress.  HEART: normal rate, heart sounds, regular rhythm RESP: normal effort, lung sounds clear and equal bilaterally GI: Abd soft, non-tender, gravid appropriate for gestational age.  MS: Extremities nontender, no edema, normal ROM Neurologic: Alert and oriented x 4.  GU: Neg CVAT.      FHT:  Baseline 150 , moderate variability, accelerations present, short variables c/w gestational age Contractions: None on toco or to palpation   Labs: Results for orders placed or performed during the hospital encounter of 10/22/16 (from the past 24 hour(s))  Urinalysis, Routine w reflex microscopic     Status: Abnormal   Collection Time: 10/22/16  1:00 PM  Result Value Ref Range   Color, Urine YELLOW YELLOW   APPearance HAZY (A) CLEAR   Specific Gravity, Urine 1.015 1.005 - 1.030   pH 6.5 5.0 - 8.0   Glucose, UA NEGATIVE NEGATIVE mg/dL   Hgb urine dipstick NEGATIVE NEGATIVE   Bilirubin Urine NEGATIVE NEGATIVE   Ketones, ur  NEGATIVE NEGATIVE mg/dL   Protein, ur NEGATIVE NEGATIVE mg/dL   Nitrite NEGATIVE NEGATIVE   Leukocytes, UA NEGATIVE NEGATIVE  Protein / creatinine ratio, urine     Status: Abnormal   Collection Time: 10/22/16  1:24 PM  Result Value Ref Range   Creatinine, Urine 122.00 mg/dL   Total Protein, Urine 21 mg/dL   Protein Creatinine Ratio 0.17 (H) 0.00 - 0.15 mg/mg[Cre]  CBC     Status: Abnormal   Collection Time: 10/22/16  1:30 PM  Result Value Ref Range   WBC 13.6 (H) 4.0 - 10.5  K/uL   RBC 5.54 (H) 3.87 - 5.11 MIL/uL   Hemoglobin 11.5 (L) 12.0 - 15.0 g/dL   HCT 16.1 09.6 - 04.5 %   MCV 67.5 (L) 78.0 - 100.0 fL   MCH 20.8 (L) 26.0 - 34.0 pg   MCHC 30.7 30.0 - 36.0 g/dL   RDW 40.9 (H) 81.1 - 91.4 %   Platelets 387 150 - 400 K/uL  Comprehensive metabolic panel     Status: Abnormal   Collection Time: 10/22/16  1:30 PM  Result Value Ref Range   Sodium 135 135 - 145 mmol/L   Potassium 3.8 3.5 - 5.1 mmol/L   Chloride 104 101 - 111 mmol/L   CO2 21 (L) 22 - 32 mmol/L   Glucose, Bld 84 65 - 99 mg/dL   BUN 8 6 - 20 mg/dL   Creatinine, Ser 7.82 0.44 - 1.00 mg/dL   Calcium 8.7 (L) 8.9 - 10.3 mg/dL   Total Protein 7.8 6.5 - 8.1 g/dL   Albumin 3.1 (L) 3.5 - 5.0 g/dL   AST 14 (L) 15 - 41 U/L   ALT 10 (L) 14 - 54 U/L   Alkaline Phosphatase 62 38 - 126 U/L   Total Bilirubin 0.2 (L) 0.3 - 1.2 mg/dL   GFR calc non Af Amer >60 >60 mL/min   GFR calc Af Amer >60 >60 mL/min   Anion gap 10 5 - 15   O/Positive/-- (12/22 1148)  Imaging:    MAU Course/MDM: I have ordered labs and reviewed results.  NST reviewed and reactive, minimal variability after Flexeril given, variability returned Flexeril 10 mg PO given with no reduction in h/a Consult Dr Erin Fulling with presentation, exam findings and test results.  Reglan 10 mg PO and Ibuprofen 600 mg PO given with some improvement in pt h/a  No evidence of preeclampsia today with normal labs and improved h/a.  H/a symptoms c/w migraine.  Will treat  with Reglan and Flexeril at home, pt declines short course of ibuprofen.  Pt declines narcotic medication.   Pt encouraged to eat and drink regularly and not skip meals. Letter provided for pt to miss work until Monday, message sent to [facility] office to transfer care from [place] to begin with BP check next week Recommend she see headache specialist at [facility] if h/a persists Preeclampsia precautions reviewed Pt stable at time of discharge.   Assessment: 1. Chronic hypertension with exacerbation during pregnancy in second trimester   2. Chronic hypertension during pregnancy, antepartum   3. Headache in pregnancy, antepartum, third trimester   4. Migraine without status migrainosus, not intractable, unspecified migraine type     Plan: Discharge home with preeclampsia precautions  Follow-up Information    [facility] for [facility] Follow up.   Specialty:  Obstetrics and Gynecology Why:  The office will call you to set up blood pressure check in 1 week and transfer of prenatal care.  Return to MAU as needed for emergencies. Contact information: [address] [place] [address] [phone]         Allergies as of 10/22/2016   No Known Allergies     Medication List    STOP taking these medications   Butalbital-APAP-Caffeine 50-325-40 MG capsule     TAKE these medications   acetaminophen 325 MG tablet Commonly known as:  TYLENOL Take 325 mg by mouth every 6 (six) hours as needed for headache.   aspirin EC 81 MG tablet Take 81 mg by  mouth daily.   cephALEXin 500 MG capsule Commonly known as:  KEFLEX Take 1 capsule (500 mg total) by mouth 3 (three) times daily.   cyclobenzaprine 10 MG tablet Commonly known as:  FLEXERIL Take 1 tablet (10 mg total) by mouth 3 (three) times daily as needed for muscle spasms. What changed:  when to take this   labetalol 200 MG tablet Commonly known as:  NORMODYNE Take 1 tablet (200 mg total) by mouth 3  (three) times daily. What changed:  when to take this   metoCLOPramide 10 MG tablet Commonly known as:  REGLAN Take 1 tablet (10 mg total) by mouth every 6 (six) hours as needed for nausea.   prenatal multivitamin Tabs tablet Take 1 tablet by mouth daily at 12 noon.       Sharen Counter Certified Nurse-Midwife 10/22/2016 5:21 PM

## 2016-10-22 NOTE — Progress Notes (Signed)
Subjective:  Bonita QuinKrystin N Dillon is a 31 y.o. [phone] at 6146w4d being seen today for ongoing prenatal care.  She is currently monitored for the following issues for this high-risk pregnancy and has H/O placenta previa; H/O: C-section; H/O migraine (no meds); Obesity (BMI 30-39.9); Rubella non-immune status, antepartum; H/O gastroesophageal reflux (GERD); Restless leg syndrome; Hx of preeclampsia, prior pregnancy, currently pregnant; Supervision of high risk pregnancy, antepartum; History of gestational diabetes mellitus (GDM); and Chronic hypertension during pregnancy, antepartum on her problem list.  Patient reports nausea and headache.  Contractions: Not present. Vag. Bleeding: None.  Movement: Present. Denies leaking of fluid.   The following portions of the patient's history were reviewed and updated as appropriate: allergies, current medications, past family history, past medical history, past social history, past surgical history and problem list. Problem list updated.  Objective:   Vitals:   10/22/16 0954 10/22/16 1126  BP: (!) 150/108 130/80  Pulse: 78 66  Weight: 262 lb (118.8 kg)     Fetal Status: Fetal Heart Rate (bpm): 152   Movement: Present     General:  Alert, oriented and cooperative. Patient is in no acute distress.  Skin: Skin is warm and dry. No rash noted.   Cardiovascular: Normal heart rate noted  Respiratory: Normal respiratory effort, no problems with respiration noted  Abdomen: Soft, gravid, appropriate for gestational age. Pain/Pressure: Absent     Pelvic:  Cervical exam deferred        Extremities: Normal range of motion.  Edema: Trace  Mental Status: Normal mood and affect. Normal behavior. Normal judgment and thought content.   Urinalysis: Urine Protein: 2+ Urine Glucose: Negative  Assessment and Plan:  Pregnancy: [id number] at 546w4d  1. Chronic hypertension during pregnancy, antepartum BP elevated as noted above. Repeat improved but with other Sx and H/O PEC  feel more monitoring is warranted Will send to MAU.   2. Hx of preeclampsia, prior pregnancy, currently pregnant   3. Supervision of high risk pregnancy, antepartum   Preterm labor symptoms and general obstetric precautions including but not limited to vaginal bleeding, contractions, leaking of fluid and fetal movement were reviewed in detail with the patient. Please refer to After Visit Summary for other counseling recommendations.  No Follow-up on file.   Hermina StaggersMichael L Alwilda Gilland, MD

## 2016-10-28 ENCOUNTER — Ambulatory Visit: Payer: Medicaid Other | Admitting: *Deleted

## 2016-10-28 VITALS — BP 147/98 | HR 70

## 2016-10-28 DIAGNOSIS — O10912 Unspecified pre-existing hypertension complicating pregnancy, second trimester: Secondary | ICD-10-CM

## 2016-10-28 NOTE — Progress Notes (Addendum)
Pt is here today for BP check, BP = 147/98, states she has occasional headaches but has improved some since hospital visit. Results reviewed with Dr Macon Large who was present in the office. Reviewed pre eclampsia precautions with patient and instructed to continue Labetolol  twice daily as instructed. Pt will follow-up on 11-05-16 for OB check.

## 2016-11-04 ENCOUNTER — Inpatient Hospital Stay (HOSPITAL_COMMUNITY): Payer: Medicaid Other

## 2016-11-04 ENCOUNTER — Inpatient Hospital Stay (HOSPITAL_COMMUNITY): Payer: Medicaid Other | Admitting: Anesthesiology

## 2016-11-04 ENCOUNTER — Ambulatory Visit (INDEPENDENT_AMBULATORY_CARE_PROVIDER_SITE_OTHER): Payer: Medicaid Other | Admitting: Family Medicine

## 2016-11-04 ENCOUNTER — Inpatient Hospital Stay (HOSPITAL_COMMUNITY)
Admission: AD | Admit: 2016-11-04 | Discharge: 2016-11-06 | DRG: 774 | Disposition: A | Discharge status: Home / Self Care | Payer: Medicaid Other | Source: Ambulatory Visit | Attending: Family Medicine | Admitting: Family Medicine

## 2016-11-04 ENCOUNTER — Encounter (HOSPITAL_COMMUNITY): Payer: Self-pay

## 2016-11-04 VITALS — BP 146/104 | HR 89 | Wt 262.0 lb

## 2016-11-04 DIAGNOSIS — O0990 Supervision of high risk pregnancy, unspecified, unspecified trimester: Secondary | ICD-10-CM

## 2016-11-04 DIAGNOSIS — Z6841 Body Mass Index (BMI) 40.0 and over, adult: Secondary | ICD-10-CM

## 2016-11-04 DIAGNOSIS — O1002 Pre-existing essential hypertension complicating childbirth: Secondary | ICD-10-CM | POA: Diagnosis present

## 2016-11-04 DIAGNOSIS — O09292 Supervision of pregnancy with other poor reproductive or obstetric history, second trimester: Secondary | ICD-10-CM | POA: Diagnosis not present

## 2016-11-04 DIAGNOSIS — O34219 Maternal care for unspecified type scar from previous cesarean delivery: Secondary | ICD-10-CM | POA: Diagnosis not present

## 2016-11-04 DIAGNOSIS — Z98891 History of uterine scar from previous surgery: Secondary | ICD-10-CM

## 2016-11-04 DIAGNOSIS — K219 Gastro-esophageal reflux disease without esophagitis: Secondary | ICD-10-CM | POA: Diagnosis present

## 2016-11-04 DIAGNOSIS — Z3A25 25 weeks gestation of pregnancy: Secondary | ICD-10-CM

## 2016-11-04 DIAGNOSIS — O10912 Unspecified pre-existing hypertension complicating pregnancy, second trimester: Secondary | ICD-10-CM

## 2016-11-04 DIAGNOSIS — O99214 Obesity complicating childbirth: Secondary | ICD-10-CM | POA: Diagnosis present

## 2016-11-04 DIAGNOSIS — O364XX0 Maternal care for intrauterine death, not applicable or unspecified: Secondary | ICD-10-CM

## 2016-11-04 DIAGNOSIS — O34211 Maternal care for low transverse scar from previous cesarean delivery: Secondary | ICD-10-CM | POA: Diagnosis present

## 2016-11-04 DIAGNOSIS — O114 Pre-existing hypertension with pre-eclampsia, complicating childbirth: Secondary | ICD-10-CM | POA: Diagnosis present

## 2016-11-04 DIAGNOSIS — O09299 Supervision of pregnancy with other poor reproductive or obstetric history, unspecified trimester: Secondary | ICD-10-CM

## 2016-11-04 DIAGNOSIS — O10919 Unspecified pre-existing hypertension complicating pregnancy, unspecified trimester: Secondary | ICD-10-CM

## 2016-11-04 DIAGNOSIS — Z8672 Personal history of thrombophlebitis: Secondary | ICD-10-CM

## 2016-11-04 DIAGNOSIS — O9962 Diseases of the digestive system complicating childbirth: Secondary | ICD-10-CM | POA: Diagnosis present

## 2016-11-04 DIAGNOSIS — Z369 Encounter for antenatal screening, unspecified: Secondary | ICD-10-CM

## 2016-11-04 DIAGNOSIS — Z711 Person with feared health complaint in whom no diagnosis is made: Secondary | ICD-10-CM

## 2016-11-04 HISTORY — DX: Headache: R51

## 2016-11-04 HISTORY — DX: Headache, unspecified: R51.9

## 2016-11-04 LAB — CBC
HCT: 36.5 % (ref 36.0–46.0)
Hemoglobin: 11.1 g/dL — ABNORMAL LOW (ref 12.0–15.0)
MCH: 20.8 pg — AB (ref 26.0–34.0)
MCHC: 30.4 g/dL (ref 30.0–36.0)
MCV: 68.5 fL — ABNORMAL LOW (ref 78.0–100.0)
PLATELETS: 360 10*3/uL (ref 150–400)
RBC: 5.33 MIL/uL — ABNORMAL HIGH (ref 3.87–5.11)
RDW: 16.8 % — AB (ref 11.5–15.5)
WBC: 13.1 10*3/uL — ABNORMAL HIGH (ref 4.0–10.5)

## 2016-11-04 LAB — PROTEIN / CREATININE RATIO, URINE
CREATININE, URINE: 170 mg/dL
Protein Creatinine Ratio: 0.44 mg/mg{Cre} — ABNORMAL HIGH (ref 0.00–0.15)
Total Protein, Urine: 74 mg/dL

## 2016-11-04 LAB — COMPREHENSIVE METABOLIC PANEL
ALK PHOS: 77 U/L (ref 38–126)
ALT: 12 U/L — AB (ref 14–54)
AST: 16 U/L (ref 15–41)
Albumin: 3.1 g/dL — ABNORMAL LOW (ref 3.5–5.0)
Anion gap: 9 (ref 5–15)
BUN: 11 mg/dL (ref 6–20)
CALCIUM: 9 mg/dL (ref 8.9–10.3)
CHLORIDE: 106 mmol/L (ref 101–111)
CO2: 20 mmol/L — AB (ref 22–32)
CREATININE: 0.61 mg/dL (ref 0.44–1.00)
GFR calc Af Amer: >60 mL/min (ref >60)
GFR calc non Af Amer: >60 mL/min (ref >60)
GLUCOSE: 99 mg/dL (ref 65–99)
Potassium: 4.2 mmol/L (ref 3.5–5.1)
SODIUM: 135 mmol/L (ref 135–145)
Total Bilirubin: 0.6 mg/dL (ref 0.3–1.2)
Total Protein: 7.2 g/dL (ref 6.5–8.1)

## 2016-11-04 LAB — TYPE AND SCREEN
ABO/RH(D): O POS
Antibody Screen: NEGATIVE

## 2016-11-04 LAB — FIBRINOGEN: FIBRINOGEN: 459 mg/dL (ref 210–475)

## 2016-11-04 LAB — TSH: TSH: 2.661 u[IU]/mL (ref 0.350–4.500)

## 2016-11-04 MED ORDER — LACTATED RINGERS IV SOLN
500.0000 mL | Freq: Once | INTRAVENOUS | Status: AC
  Administered 2016-11-04: 500 mL via INTRAVENOUS

## 2016-11-04 MED ORDER — MISOPROSTOL 200 MCG PO TABS: 200.0000 ug | ORAL_TABLET | ORAL | Status: DC | PRN

## 2016-11-04 MED ORDER — HYDROXYZINE HCL 25 MG PO TABS
25.0000 mg | ORAL_TABLET | Freq: Three times a day (TID) | ORAL | Status: DC | PRN
  Filled 2016-11-04: qty 1

## 2016-11-04 MED ORDER — ACETAMINOPHEN 325 MG PO TABS
650.0000 mg | ORAL_TABLET | ORAL | Status: DC | PRN
  Administered 2016-11-04: 650 mg via ORAL
  Filled 2016-11-04: qty 2

## 2016-11-04 MED ORDER — MISOPROSTOL 50MCG HALF TABLET: 100.0000 ug | ORAL_TABLET | ORAL | Status: DC | PRN

## 2016-11-04 MED ORDER — MISOPROSTOL 50MCG HALF TABLET
100.0000 ug | ORAL_TABLET | ORAL | Status: AC | PRN
  Administered 2016-11-05 (×2): 50 ug via VAGINAL
  Filled 2016-11-04: qty 2

## 2016-11-04 MED ORDER — OXYCODONE-ACETAMINOPHEN 5-325 MG PO TABS
2.0000 | ORAL_TABLET | ORAL | Status: DC | PRN
  Administered 2016-11-05: 2 via ORAL
  Filled 2016-11-04: qty 2

## 2016-11-04 MED ORDER — PHENYLEPHRINE 40 MCG/ML (10ML) SYRINGE FOR IV PUSH (FOR BLOOD PRESSURE SUPPORT)
PREFILLED_SYRINGE | INTRAVENOUS | Status: AC
  Filled 2016-11-04: qty 10

## 2016-11-04 MED ORDER — OXYTOCIN BOLUS FROM INFUSION
500.0000 mL | Freq: Once | INTRAVENOUS | Status: AC
  Administered 2016-11-05: 500 mL via INTRAVENOUS

## 2016-11-04 MED ORDER — LABETALOL HCL 200 MG PO TABS
200.0000 mg | ORAL_TABLET | Freq: Three times a day (TID) | ORAL | Status: DC
  Administered 2016-11-04 – 2016-11-05 (×3): 200 mg via ORAL
  Filled 2016-11-04 (×3): qty 1

## 2016-11-04 MED ORDER — FENTANYL 2.5 MCG/ML BUPIVACAINE 1/10 % EPIDURAL INFUSION (WH - ANES)
INTRAMUSCULAR | Status: AC
  Filled 2016-11-04: qty 100

## 2016-11-04 MED ORDER — DIPHENHYDRAMINE HCL 50 MG/ML IJ SOLN
12.5000 mg | INTRAMUSCULAR | Status: AC | PRN
  Administered 2016-11-04 – 2016-11-05 (×3): 12.5 mg via INTRAVENOUS
  Filled 2016-11-04 (×2): qty 1

## 2016-11-04 MED ORDER — SOD CITRATE-CITRIC ACID 500-334 MG/5ML PO SOLN: 30.0000 mL | ORAL | Status: DC | PRN

## 2016-11-04 MED ORDER — MISOPROSTOL 50MCG HALF TABLET
50.0000 ug | ORAL_TABLET | ORAL | Status: DC | PRN
  Administered 2016-11-04: 50 ug via VAGINAL
  Filled 2016-11-04: qty 1

## 2016-11-04 MED ORDER — LORAZEPAM 1 MG PO TABS: 1.0000 mg | ORAL_TABLET | ORAL | Status: DC | PRN

## 2016-11-04 MED ORDER — LORAZEPAM 1 MG PO TABS: 1.0000 mg | ORAL_TABLET | Freq: Once | ORAL | Status: DC

## 2016-11-04 MED ORDER — LIDOCAINE HCL (PF) 1 % IJ SOLN
INTRAMUSCULAR | Status: DC | PRN
  Administered 2016-11-04 (×2): 5 mL

## 2016-11-04 MED ORDER — HYDRALAZINE HCL 20 MG/ML IJ SOLN: 10.0000 mg | Freq: Once | INTRAMUSCULAR | Status: DC | PRN

## 2016-11-04 MED ORDER — FENTANYL 2.5 MCG/ML BUPIVACAINE 1/10 % EPIDURAL INFUSION (WH - ANES)
14.0000 mL/h | INTRAMUSCULAR | Status: DC | PRN
  Administered 2016-11-04 – 2016-11-05 (×2): 14 mL/h via EPIDURAL
  Filled 2016-11-04: qty 100

## 2016-11-04 MED ORDER — LACTATED RINGERS IV SOLN: 500.0000 mL | INTRAVENOUS | Status: DC | PRN

## 2016-11-04 MED ORDER — LORAZEPAM 2 MG/ML IJ SOLN: 1.0000 mg | Freq: Once | INTRAMUSCULAR | Status: DC

## 2016-11-04 MED ORDER — FENTANYL CITRATE (PF) 100 MCG/2ML IJ SOLN: 100.0000 ug | INTRAMUSCULAR | Status: DC | PRN

## 2016-11-04 MED ORDER — OXYCODONE-ACETAMINOPHEN 5-325 MG PO TABS: 1.0000 | ORAL_TABLET | ORAL | Status: DC | PRN

## 2016-11-04 MED ORDER — LACTATED RINGERS IV BOLUS (SEPSIS)
1000.0000 mL | Freq: Once | INTRAVENOUS | Status: AC
  Administered 2016-11-04: 1000 mL via INTRAVENOUS

## 2016-11-04 MED ORDER — EPHEDRINE 5 MG/ML INJ
10.0000 mg | INTRAVENOUS | Status: DC | PRN
  Filled 2016-11-04: qty 2

## 2016-11-04 MED ORDER — LABETALOL HCL 5 MG/ML IV SOLN: 20.0000 mg | INTRAVENOUS | Status: DC | PRN

## 2016-11-04 MED ORDER — LIDOCAINE HCL (PF) 1 % IJ SOLN
30.0000 mL | INTRAMUSCULAR | Status: DC | PRN
  Filled 2016-11-04: qty 30

## 2016-11-04 MED ORDER — ONDANSETRON HCL 4 MG/2ML IJ SOLN: 4.0000 mg | Freq: Four times a day (QID) | INTRAMUSCULAR | Status: DC | PRN

## 2016-11-04 MED ORDER — OXYTOCIN 40 UNITS IN LACTATED RINGERS INFUSION - SIMPLE MED
2.5000 [IU]/h | INTRAVENOUS | Status: DC
  Filled 2016-11-04: qty 1000

## 2016-11-04 MED ORDER — PHENYLEPHRINE 40 MCG/ML (10ML) SYRINGE FOR IV PUSH (FOR BLOOD PRESSURE SUPPORT)
80.0000 ug | PREFILLED_SYRINGE | INTRAVENOUS | Status: DC | PRN
  Filled 2016-11-04: qty 5

## 2016-11-04 MED ORDER — LACTATED RINGERS IV SOLN
INTRAVENOUS | Status: DC
  Administered 2016-11-04 – 2016-11-05 (×2): via INTRAVENOUS

## 2016-11-04 MED ORDER — LORAZEPAM 1 MG PO TABS
1.0000 mg | ORAL_TABLET | Freq: Once | ORAL | Status: AC
  Administered 2016-11-04: 1 mg via ORAL
  Filled 2016-11-04: qty 1

## 2016-11-04 MED ORDER — MISOPROSTOL 200 MCG PO TABS: 400.0000 ug | ORAL_TABLET | ORAL | Status: DC | PRN

## 2016-11-04 MED ORDER — ZOLPIDEM TARTRATE 5 MG PO TABS
5.0000 mg | ORAL_TABLET | Freq: Once | ORAL | Status: AC
  Administered 2016-11-04: 5 mg via ORAL
  Filled 2016-11-04: qty 1

## 2016-11-04 NOTE — Progress Notes (Signed)
Pt here for routine OB. Transferred from [facility]. FHR not heard with doppler. Moved pt to ultrasound room. No FHR seen on bedside US.

## 2016-11-04 NOTE — Progress Notes (Signed)
Patient ID: NASHIRA MCGLYNN, female   DOB: July 05, 1986, 31 y.o.   MRN: [id number] AAMARI WEST is a 31 y.o. [phone] at [redacted]w[redacted]d admitted for induction of labor due to IUFD dx today. H/O LTCS x 3, 1st @ 28wks d/t severe pre-e w/ pulmonary edema, 2nd- repeat at term, 3rd @ 31wks on 01/04/16 d/t severe pre-e w/ pulmonary edema-2 layer closure.   Subjective: Comfortable with epidural. Denies headache, visual changes, ruq/epigastric pain, n/v, any recent changes. Denies sob. States she did have a bad headache at end of March, went to MAU for pre-e eval d/t her h/o severe pre-e w/ pulmonary edema @ 28 and 31wks with previous pregnancies- work up was neg. P:C at that time was 0.17. No headache since. Reports some increased swelling over past few weeks.  No uc's/cramping.   Objective: BP (!) 150/94   Pulse 72   Temp 97.9 F (36.6 C) (Oral)   Resp 18   LMP 05/06/2016 (Within Weeks)   SpO2 100%  No intake/output data recorded.  Pt teary, has good support at bedside  FHT:  n/a UC:   none  SVE:   Cl/thick, per u/s earlier today baby is transverse w/ head to maternal Lt Cytotec placed in posterior fornix  Labs: Lab Results  Component Value Date   WBC 13.1 (H) 11/04/2016   HGB 11.1 (L) 11/04/2016   HCT 36.5 11/04/2016   MCV 68.5 (L) 11/04/2016   PLT 360 11/04/2016    Results for orders placed or performed during the hospital encounter of 11/04/16 (from the past 24 hour(s))  Protein / creatinine ratio, urine     Status: Abnormal   Collection Time: 11/04/16  5:19 PM  Result Value Ref Range   Creatinine, Urine 170.00 mg/dL   Total Protein, Urine 74 mg/dL   Protein Creatinine Ratio 0.44 (H) 0.00 - 0.15 mg/mg[Cre]  Comprehensive metabolic panel     Status: Abnormal   Collection Time: 11/04/16  5:29 PM  Result Value Ref Range   Sodium 135 135 - 145 mmol/L   Potassium 4.2 3.5 - 5.1 mmol/L   Chloride 106 101 - 111 mmol/L   CO2 20 (L) 22 - 32 mmol/L   Glucose, Bld 99 65 - 99 mg/dL   BUN  11 6 - 20 mg/dL   Creatinine, Ser 1.47 0.44 - 1.00 mg/dL   Calcium 9.0 8.9 - 82.9 mg/dL   Total Protein 7.2 6.5 - 8.1 g/dL   Albumin 3.1 (L) 3.5 - 5.0 g/dL   AST 16 15 - 41 U/L   ALT 12 (L) 14 - 54 U/L   Alkaline Phosphatase 77 38 - 126 U/L   Total Bilirubin 0.6 0.3 - 1.2 mg/dL   GFR calc non Af Amer >60 >60 mL/min   GFR calc Af Amer >60 >60 mL/min   Anion gap 9 5 - 15  CBC     Status: Abnormal   Collection Time: 11/04/16  5:29 PM  Result Value Ref Range   WBC 13.1 (H) 4.0 - 10.5 K/uL   RBC 5.33 (H) 3.87 - 5.11 MIL/uL   Hemoglobin 11.1 (L) 12.0 - 15.0 g/dL   HCT 56.2 13.0 - 86.5 %   MCV 68.5 (L) 78.0 - 100.0 fL   MCH 20.8 (L) 26.0 - 34.0 pg   MCHC 30.4 30.0 - 36.0 g/dL   RDW 78.4 (H) 69.6 - 29.5 %   Platelets 360 150 - 400 K/uL  Fibrinogen     Status: None  Collection Time: 11/04/16  5:29 PM  Result Value Ref Range   Fibrinogen 459 210 - 475 mg/dL  TSH     Status: None   Collection Time: 11/04/16  5:29 PM  Result Value Ref Range   TSH 2.661 0.350 - 4.500 uIU/mL  Type and screen [facility]     Status: None   Collection Time: 11/04/16  6:42 PM  Result Value Ref Range   ABO/RH(D) O POS    Antibody Screen NEG    Sample Expiration 11/07/2016      Assessment / Plan: IOL d/t IUFD dx today. H/O LTCS x 3. Fetus transverse w/ head to maternal Lt on u/s today. Dr. Adrian Blackwater aware of all- recommended cytotec 9mcg>100mcg>200mcg q 4hrs. Dr. Genevie Ann discussed risks/benefits of repeat c/s vs. TOLAC in MAU, pt and her significant other understand there are risks w/ both, they prefer TOLAC. Understands risks of potential uterine rupture, although low.   Labor: cervical ripening Fetal Wellbeing:  n/a Pain Control:  Epidural Pre-eclampsia: CHTN w/ superimposed pre-e w/o severe features at this time, P:C 0.44, will continue to monitor for severe range bp's/need for magnesium. Continue labetalol  po bid for now.  I/D:  n/a Anticipated MOD:  VBAC  Marge Duncans CNM, WHNP-BC 11/04/2016, 9:52 PM

## 2016-11-04 NOTE — Progress Notes (Signed)
   PRENATAL VISIT NOTE  Subjective:  Stephanie [name] is a 31 y.o. [phone] at [redacted]w[redacted]d being seen today for ongoing prenatal care.  She is currently monitored for the following issues for this high-risk pregnancy and has H/O placenta previa; H/O: C-section; H/O migraine (no meds); Obesity (BMI 30-39.9); Rubella non-immune status, antepartum; H/O gastroesophageal reflux (GERD); Restless leg syndrome; Hx of preeclampsia, prior pregnancy, currently pregnant; Supervision of high risk pregnancy, antepartum; History of gestational diabetes mellitus (GDM); and Chronic hypertension during pregnancy, antepartum on her problem list.  Patient reports no complaints. Reports no fetal movement x 2 days. Contractions: Not present.  .  Movement: Absent. Denies leaking of fluid.   The following portions of the patient's history were reviewed and updated as appropriate: allergies, current medications, past family history, past medical history, past social history, past surgical history and problem list. Problem list updated.  Objective:   Vitals:   11/04/16 1546  BP: (!) 146/104  Pulse: 89  Weight: 262 lb (118.8 kg)    Fetal Status:     Movement: Absent     General:  Alert, oriented and cooperative. Patient is in no acute distress.  Skin: Skin is warm and dry. No rash noted.   Cardiovascular: Normal heart rate noted  Respiratory: Normal respiratory effort, no problems with respiration noted  Abdomen: Soft, gravid, appropriate for gestational age. Pain/Pressure: Absent     Pelvic:  Cervical exam deferred        Extremities: Normal range of motion.     Mental Status: Normal mood and affect. Normal behavior. Normal judgment and thought content.   Assessment and Plan:  Pregnancy: [id number] at [redacted]w[redacted]d 1. Chronic hypertension during pregnancy, antepartum ? Vs. Atypical pre-eclampsia  2. H/O: C-section X 3-->consider vaginal birth if IUFD confirmed  3. Supervision of high risk pregnancy, antepartum  4. Hx of  preeclampsia, prior pregnancy, currently pregnant May have again, recommend repeating labs once at the hospital, Magnesium if indicated  5. IUFD at 20 weeks or more of gestation Likely--will get confirmatory diagnosis in MAU   Please refer to After Visit Summary for other counseling recommendations.  Return in 4 weeks (on 12/02/2016).   Reva Bores, MD

## 2016-11-04 NOTE — Anesthesia Procedure Notes (Signed)
Epidural Patient location during procedure: OB  Staffing Anesthesiologist: Clearnce Leja Performed: anesthesiologist   Preanesthetic Checklist Completed: patient identified, site marked, surgical consent, pre-op evaluation, timeout performed, IV checked, risks and benefits discussed and monitors and equipment checked  Epidural Patient position: sitting Prep: DuraPrep Patient monitoring: heart rate, continuous pulse ox and blood pressure Approach: right paramedian Location: L3-L4 Injection technique: LOR saline  Needle:  Needle type: Tuohy  Needle gauge: 17 G Needle length: 9 cm and 9 Needle insertion depth: 8 cm Catheter type: closed end flexible Catheter size: 20 Guage Catheter at skin depth: 12 cm Test dose: negative  Assessment Events: blood not aspirated, injection not painful, no injection resistance, negative IV test and no paresthesia  Additional Notes Patient identified. Risks/Benefits/Options discussed with patient including but not limited to bleeding, infection, nerve damage, paralysis, failed block, incomplete pain control, headache, blood pressure changes, nausea, vomiting, reactions to medication both or allergic, itching and postpartum back pain. Confirmed with bedside nurse the patient's most recent platelet count. Confirmed with patient that they are not currently taking any anticoagulation, have any bleeding history or any family history of bleeding disorders. Patient expressed understanding and wished to proceed. All questions were answered. Sterile technique was used throughout the entire procedure. Please see nursing notes for vital signs. Test dose was given through epidural needle and negative prior to continuing to dose epidural or start infusion. Warning signs of high block given to the patient including shortness of breath, tingling/numbness in hands, complete motor block, or any concerning symptoms with instructions to call for help. Patient was given  instructions on fall risk and not to get out of bed. All questions and concerns addressed with instructions to call with any issues.     

## 2016-11-04 NOTE — Anesthesia Preprocedure Evaluation (Signed)
Anesthesia Evaluation  Patient identified by MRN, date of birth, ID band Patient awake    Reviewed: Allergy & Precautions, NPO status , Patient's Chart, lab work & pertinent test results  Airway Mallampati: II  TM Distance: >3 FB Neck ROM: Full    Dental no notable dental hx.    Pulmonary neg pulmonary ROS,    Pulmonary exam normal breath sounds clear to auscultation       Cardiovascular hypertension, Pt. on medications and Pt. on home beta blockers Normal cardiovascular exam Rhythm:Regular Rate:Normal     Neuro/Psych negative neurological ROS  negative psych ROS   GI/Hepatic negative GI ROS, Neg liver ROS,   Endo/Other  Morbid obesity  Renal/GU negative Renal ROS  negative genitourinary   Musculoskeletal negative musculoskeletal ROS (+)   Abdominal   Peds negative pediatric ROS (+)  Hematology negative hematology ROS (+)   Anesthesia Other Findings   Reproductive/Obstetrics (+) Pregnancy IUFD                             Anesthesia Physical Anesthesia Plan  ASA: III  Anesthesia Plan: Epidural   Post-op Pain Management:    Induction:   Airway Management Planned:   Additional Equipment:   Intra-op Plan:   Post-operative Plan:   Informed Consent: I have reviewed the patients History and Physical, chart, labs and discussed the procedure including the risks, benefits and alternatives for the proposed anesthesia with the patient or authorized representative who has indicated his/her understanding and acceptance.     Plan Discussed with: CRNA  Anesthesia Plan Comments:         Anesthesia Quick Evaluation

## 2016-11-04 NOTE — Progress Notes (Addendum)
G4P3 @ 25+[redacted] wksga. Presents to triage for HTN and r/o IUFD. Denies LOF or bleeding.   Hx of 3 c/s. 1st and 3rd preterm delivery Hx of DVT 2017  Lab at bs  1748: U/S at bs.   1749: Urine sent via tube by tech.   1800: U/S confirmed no FHR.   1818: report given to birthing charge nurse. Pt assigned to room 161.   1900: pt to birthing via wheelchair taken by tech.

## 2016-11-04 NOTE — H&P (Signed)
LABOR AND DELIVERY ADMISSION HISTORY AND PHYSICAL NOTE  Stephanie [name] is a 31 y.o. female [phone] with IUP at [redacted]w[redacted]d by 10 week ultrasound presenting for IUFD.  Patient had been following at [facility] but today was her first visit at [facility] She was seen by Dr. Shawnie Pons who is unable to get heart tones and bedside ultrasound suggested IUFD. She does have chronic hypertension and a history of preeclampsia. She has to preterm delivery secondary to preeclampsia. She denies symptoms of preeclampsia including headache blurred vision abdominal pain or severe swelling at this time. She presented for care in the [facility] for confirmation of IUFD and potential admission for induction.  She reports positive fetal movement. She denies leakage of fluid or vaginal bleeding.  Prenatal History/Complications:  Past Medical History: Past Medical History:  Diagnosis Date  . Headache   . Hypertension   . Hypothyroidism    resolved  . Preeclampsia   . Preterm labor 2009   [redacted] wksga  . Preterm labor [id number]   [redacted] wksga  . Pulmonary edema    third trimester  . Thrombophlebitis     Past Surgical History: Past Surgical History:  Procedure Laterality Date  . CESAREAN SECTION    . CESAREAN SECTION N/A 01/04/2016   Procedure: CESAREAN SECTION;  Surgeon: Silverio Lay, MD;  Location: [facility] BIRTHING SUITES;  Service: Obstetrics;  Laterality: N/A;    Obstetrical History: OB History    Gravida Para Term Preterm AB Living   SAB TAB Ectopic Multiple Live Births         0 3      Social History: Social History   Social History  . Marital status: Single    Spouse name: N/A  . Number of children: N/A  . Years of education: N/A   Social History Main Topics  . Smoking status: Never Smoker  . Smokeless tobacco: Never Used  . Alcohol use No  . Drug use: No  . Sexual activity: Yes    Birth control/ protection: None   Other Topics Concern  . None   Social  History Narrative  . None    Family History: Family History  Problem Relation Age of Onset  . Hypertension Father   . Diabetes Maternal Grandmother     Allergies: No Known Allergies  Prescriptions Prior to Admission  Medication Sig Dispense Refill Last Dose  . labetalol (NORMODYNE) 200 MG tablet Take 1 tablet (200 mg total) by mouth 3 (three) times daily. 60 tablet 3 11/03/2016 at 2300  . [DISCONTINUED] acetaminophen (TYLENOL) 325 MG tablet Take 325 mg by mouth every 6 (six) hours as needed for headache.   Past Month at Unknown time  . [DISCONTINUED] aspirin EC 81 MG tablet Take 81 mg by mouth daily.   Past Week at Unknown time  . [DISCONTINUED] cyclobenzaprine (FLEXERIL) 10 MG tablet Take 1 tablet (10 mg total) by mouth 3 (three) times daily as needed for muscle spasms. 20 tablet 1 Past Week at Unknown time  . [DISCONTINUED] metoCLOPramide (REGLAN) 10 MG tablet Take 1 tablet (10 mg total) by mouth every 6 (six) hours as needed for nausea. 30 tablet 1 Past Week at Unknown time  . [DISCONTINUED] Prenatal Vit-Fe Fumarate-FA (PRENATAL MULTIVITAMIN) TABS tablet Take 1 tablet by mouth daily at 12 noon.   11/03/2016 at Unknown time  . [DISCONTINUED] cephALEXin (KEFLEX) 500 MG capsule Take 1 capsule (500 mg total) by  mouth 3 (three) times daily. 21 capsule 0 Past Week at Unknown time     Review of Systems   All systems reviewed and negative except as stated in HPI  Last menstrual period 05/06/2016, unknown if currently breastfeeding. General appearance: Tearful, anxious, mildly distressed Lungs: No acute respiratory distress Heart: regular rate distal pulses intact Abdomen: soft, non-tender; bowel sounds normal Extremities: No calf swelling or tenderness Presentation: Transverse by ultrasound Fetal monitoring: Nonviable fetus, no heartbeat on ultrasound     Prenatal labs: ABO, Rh: O/Positive/-- (12/22 1148) Antibody: Negative (12/22 1148) Rubella: Nonimmune RPR: Non Reactive (12/22  1148)  HBsAg: Negative (12/22 1148)  HIV: Non Reactive (12/22 1148)    Results for orders placed or performed during the hospital encounter of 11/04/16 (from the past 24 hour(s))  Protein / creatinine ratio, urine   Collection Time: 11/04/16  5:19 PM  Result Value Ref Range   Creatinine, Urine 170.00 mg/dL   Total Protein, Urine 74 mg/dL   Protein Creatinine Ratio 0.44 (H) 0.00 - 0.15 mg/mg[Cre]  Comprehensive metabolic panel   Collection Time: 11/04/16  5:29 PM  Result Value Ref Range   Sodium 135 135 - 145 mmol/L   Potassium 4.2 3.5 - 5.1 mmol/L   Chloride 106 101 - 111 mmol/L   CO2 20 (L) 22 - 32 mmol/L   Glucose, Bld 99 65 - 99 mg/dL   BUN 11 6 - 20 mg/dL   Creatinine, Ser 1.61 0.44 - 1.00 mg/dL   Calcium 9.0 8.9 - 09.6 mg/dL   Total Protein 7.2 6.5 - 8.1 g/dL   Albumin 3.1 (L) 3.5 - 5.0 g/dL   AST 16 15 - 41 U/L   ALT 12 (L) 14 - 54 U/L   Alkaline Phosphatase 77 38 - 126 U/L   Total Bilirubin 0.6 0.3 - 1.2 mg/dL   GFR calc non Af Amer >60 >60 mL/min   GFR calc Af Amer >60 >60 mL/min   Anion gap 9 5 - 15  CBC   Collection Time: 11/04/16  5:29 PM  Result Value Ref Range   WBC 13.1 (H) 4.0 - 10.5 K/uL   RBC 5.33 (H) 3.87 - 5.11 MIL/uL   Hemoglobin 11.1 (L) 12.0 - 15.0 g/dL   HCT 04.5 40.9 - 81.1 %   MCV 68.5 (L) 78.0 - 100.0 fL   MCH 20.8 (L) 26.0 - 34.0 pg   MCHC 30.4 30.0 - 36.0 g/dL   RDW 91.4 (H) 78.2 - 95.6 %   Platelets 360 150 - 400 K/uL    Patient Active Problem List   Diagnosis Date Noted  . Chronic hypertension during pregnancy, antepartum 09/24/2016  . Supervision of high risk pregnancy, antepartum 07/18/2016  . History of gestational diabetes mellitus (GDM) 07/18/2016  . Status post repeat low transverse cesarean section--due to severe pre-eclampsia 01/05/2016  . Hx of preeclampsia, prior pregnancy, currently pregnant 01/02/2016  . H/O placenta previa 11/29/2015  . H/O: C-section 11/29/2015  . H/O migraine (no meds) 11/29/2015  . Obesity (BMI  30-39.9) 11/29/2015  . Rubella non-immune status, antepartum 11/29/2015  . H/O gastroesophageal reflux (GERD) 11/29/2015  . Restless leg syndrome 11/29/2015    Assessment: ZORAH BACKES is a 31 y.o. [phone] at [redacted]w[redacted]d here for IUFD  #Labor:Will induce with Cytotec every 4 hours vaginally. Patient does have history of 3 previous C-sections. She was consented for VBAC, she was informed that less than 1% of trial of labor after cesareans results in uterine rupture which required  emergency C-section. In that instance of an emergency C-section risk for injury to bowel and bladder is slightly higher than in a planned C-section. Risk of bleeding is also slightly higher and in worse case scenario would result in hysterectomy. She was informed that given that she has a IUFD we would recommend vaginal delivery to decrease her risk of surgery. She does express desire to become pregnant again and especially in this case we would recommend decreasing the number of C-sections. #Pain: Patient has never experienced labor before. She would like an early epidural #chronic hypertension with superimposed preeclampsia: Patient with a protein to creatinine ratio 0.44. Protein creatinine ratio was 0.17 last time is checked a week ago. Blood pressures have been in the mild range. We will continue patient's home labetalol.pih LABS CHECKED AND THE REMAINDER ARE WITHIN NORMAL LIMITS # iufd: HEMOGLOBIN a1C, tsh, FIBRINOGEN CHECKED.  Ernestina Penna 11/04/2016, 6:24 PM  Attestation of Attending Supervision of Obstetric Fellow: Evaluation and management procedures were performed by the Obstetric Fellow under my supervision and collaboration.  I have reviewed the Obstetric Fellow's note and chart, and I agree with the management and plan.  Candelaria Celeste, DO Attending Physician Faculty Practice, [facility]

## 2016-11-05 ENCOUNTER — Encounter: Payer: Medicaid Other | Admitting: Obstetrics and Gynecology

## 2016-11-05 ENCOUNTER — Encounter (HOSPITAL_COMMUNITY): Payer: Self-pay | Admitting: *Deleted

## 2016-11-05 ENCOUNTER — Encounter: Payer: Medicaid Other | Admitting: Obstetrics & Gynecology

## 2016-11-05 DIAGNOSIS — O364XX0 Maternal care for intrauterine death, not applicable or unspecified: Secondary | ICD-10-CM

## 2016-11-05 DIAGNOSIS — Z3A25 25 weeks gestation of pregnancy: Secondary | ICD-10-CM

## 2016-11-05 LAB — HEMOGLOBIN A1C
Hgb A1c MFr Bld: 5.7 % — ABNORMAL HIGH (ref 4.8–5.6)
Mean Plasma Glucose: 117 mg/dL

## 2016-11-05 LAB — CBC
HCT: 33.8 % — ABNORMAL LOW (ref 36.0–46.0)
Hemoglobin: 10.3 g/dL — ABNORMAL LOW (ref 12.0–15.0)
MCH: 21 pg — ABNORMAL LOW (ref 26.0–34.0)
MCHC: 30.5 g/dL (ref 30.0–36.0)
MCV: 68.8 fL — ABNORMAL LOW (ref 78.0–100.0)
PLATELETS: 314 10*3/uL (ref 150–400)
RBC: 4.91 MIL/uL (ref 3.87–5.11)
RDW: 16.8 % — AB (ref 11.5–15.5)
WBC: 15.1 10*3/uL — ABNORMAL HIGH (ref 4.0–10.5)

## 2016-11-05 LAB — RPR: RPR: NONREACTIVE

## 2016-11-05 MED ORDER — WITCH HAZEL-GLYCERIN EX PADS: 1.0000 "application " | MEDICATED_PAD | CUTANEOUS | Status: DC | PRN

## 2016-11-05 MED ORDER — BENZOCAINE-MENTHOL 20-0.5 % EX AERO: 1.0000 "application " | INHALATION_SPRAY | CUTANEOUS | Status: DC | PRN

## 2016-11-05 MED ORDER — MISOPROSTOL 200 MCG PO TABS
200.0000 ug | ORAL_TABLET | ORAL | Status: DC | PRN
  Administered 2016-11-05 (×2): 200 ug via VAGINAL
  Filled 2016-11-05 (×2): qty 1

## 2016-11-05 MED ORDER — LORAZEPAM 2 MG/ML IJ SOLN
1.0000 mg | INTRAMUSCULAR | Status: DC | PRN
  Administered 2016-11-05: 1 mg via INTRAVENOUS
  Filled 2016-11-05 (×2): qty 0.5

## 2016-11-05 MED ORDER — IBUPROFEN 600 MG PO TABS
600.0000 mg | ORAL_TABLET | Freq: Four times a day (QID) | ORAL | Status: DC
  Administered 2016-11-05 – 2016-11-06 (×4): 600 mg via ORAL
  Filled 2016-11-05 (×4): qty 1

## 2016-11-05 MED ORDER — DIBUCAINE 1 % RE OINT: 1.0000 "application " | TOPICAL_OINTMENT | RECTAL | Status: DC | PRN

## 2016-11-05 MED ORDER — SIMETHICONE 80 MG PO CHEW: 80.0000 mg | CHEWABLE_TABLET | ORAL | Status: DC | PRN

## 2016-11-05 MED ORDER — ONDANSETRON HCL 4 MG/2ML IJ SOLN: 4.0000 mg | INTRAMUSCULAR | Status: DC | PRN

## 2016-11-05 MED ORDER — SENNOSIDES-DOCUSATE SODIUM 8.6-50 MG PO TABS
2.0000 | ORAL_TABLET | ORAL | Status: DC
  Filled 2016-11-05: qty 2

## 2016-11-05 MED ORDER — ACETAMINOPHEN 325 MG PO TABS
650.0000 mg | ORAL_TABLET | ORAL | Status: DC | PRN
  Administered 2016-11-06: 650 mg via ORAL
  Filled 2016-11-05: qty 2

## 2016-11-05 MED ORDER — ONDANSETRON HCL 4 MG PO TABS: 4.0000 mg | ORAL_TABLET | ORAL | Status: DC | PRN

## 2016-11-05 MED ORDER — TETANUS-DIPHTH-ACELL PERTUSSIS 5-2.5-18.5 LF-MCG/0.5 IM SUSP: 0.5000 mL | Freq: Once | INTRAMUSCULAR | Status: DC

## 2016-11-05 MED ORDER — DIPHENHYDRAMINE HCL 25 MG PO CAPS: 25.0000 mg | ORAL_CAPSULE | Freq: Four times a day (QID) | ORAL | Status: DC | PRN

## 2016-11-05 MED ORDER — LORAZEPAM 1 MG PO TABS
1.0000 mg | ORAL_TABLET | Freq: Four times a day (QID) | ORAL | Status: DC | PRN
  Administered 2016-11-05 – 2016-11-06 (×2): 1 mg via ORAL
  Filled 2016-11-05 (×2): qty 1

## 2016-11-05 MED ORDER — ZOLPIDEM TARTRATE 5 MG PO TABS
5.0000 mg | ORAL_TABLET | Freq: Every evening | ORAL | Status: DC | PRN
  Administered 2016-11-05: 5 mg via ORAL
  Filled 2016-11-05: qty 1

## 2016-11-05 MED ORDER — PRENATAL MULTIVITAMIN CH: 1.0000 | ORAL_TABLET | Freq: Every day | ORAL | Status: DC

## 2016-11-05 MED ORDER — COCONUT OIL OIL: 1.0000 "application " | TOPICAL_OIL | Status: DC | PRN

## 2016-11-05 NOTE — Progress Notes (Signed)
Patient ID: Stephanie [name], female   DOB: February 11, 1986, 31 y.o.   MRN: [id number] Stephanie [name] is a 31 y.o. [phone] at [redacted]w[redacted]d admitted for induction of labor due to IUFD.  Subjective: Comfortable w/ epidural, slept all night. Denies ha, visual changes, ruq/epigastric pain, n/v.    Objective: BP (!) 111/55   Pulse 67   Temp 98 F (36.7 C) (Oral)   Resp 18   Ht  (1.676 m)   Wt 118.8 kg (262 lb)   LMP 05/06/2016 (Within Weeks)   SpO2 100%   BMI 42.29 kg/m  No intake/output data recorded. Had 1 severe-range bp during night, none since  FHT:  n/a UC:   irregular  SVE:   Dilation: 1 Effacement (%): Thick Station:  (no presenting part, fetus transverse) Exam by:: kbooker, cnm  Cervical foley bulb inserted and inflated w/ 60ml LR w/o difficulty   Labs: Lab Results  Component Value Date   WBC 13.1 (H) 11/04/2016   HGB 11.1 (L) 11/04/2016   HCT 36.5 11/04/2016   MCV 68.5 (L) 11/04/2016   PLT 360 11/04/2016    Assessment / Plan: IOL d/t IUFD, s/p cytotec 50>100>268mcg (last dose @ 0550). Cervical foley bulb now in place, plan to continue cytotec q 4hr pv  Labor: cervical ripening Fetal Wellbeing:  n/a Pain Control:  Epidural Pre-eclampsia: w/o severe features I/D:  n/a Anticipated MOD:  TOLAC  Marge Duncans CNM, WHNP-BC 11/05/2016, 7:58 AM

## 2016-11-05 NOTE — Progress Notes (Signed)
0.91mL of Ativan given per order; 0.34mL WIS. Witnessed by Purvis Kilts, RN. Will continue to monitor patient.

## 2016-11-05 NOTE — Progress Notes (Signed)
Patient ID: Stephanie [name], female   DOB: 1985-10-15, 31 y.o.   MRN: [id number] Sleepy.  Comfortable with epidural  . Vitals:   11/05/16 0755 11/05/16 0757 11/05/16 0802 11/05/16 0831  BP:  (!) 158/71 137/74 (!) 142/86  Pulse:  79 76 80  Resp: Temp:      TempSrc:      SpO2:      Weight:      Height:       Dilation: 1 Effacement (%): Thick Cervical Position: Posterior Station:  (no presenting part, fetus transverse) Presentation: Transverse Exam by:: kbooker, cnm  Will continue plan of care Reviewed with Dr Shawnie Pons

## 2016-11-05 NOTE — Progress Notes (Signed)
I offered follow up bereavement care to [facility] and family after the delivery of her baby.  She had good love and support from friends and family.  FOB, Josh, was not interested in talking or thinking about their baby and just wanted to move on and try for another.    We will follow up tomorrow, but if needs arise overnight, please also page, [phone].  Chaplain Dyanne Carrel, Bcc Pager, [phone] 4:07 PM    11/05/16 1600  Clinical Encounter Type  Visited With Patient and family together  Visit Type Spiritual support

## 2016-11-05 NOTE — Progress Notes (Signed)
Patient ID: Stephanie [name], female   DOB: 11-Dec-1985, 31 y.o.   MRN: [id number] Feeling "sweaty and clammy" Denies trouble breathing or chest pain HR 90s Lungs clear  Vitals:   11/05/16 1431 11/05/16 1446 11/05/16 1501 11/05/16 1602  BP: (!) 154/89 (!) 156/91 (!) 158/91 (!) 144/77  Pulse: 79 74 80 69  Resp: Temp:    98.2 F (36.8 C)  TempSrc:    Oral  SpO2:      Weight:      Height:        Results for orders placed or performed during the hospital encounter of 11/04/16 (from the past 24 hour(s))  Protein / creatinine ratio, urine     Status: Abnormal   Collection Time: 11/04/16  5:19 PM  Result Value Ref Range   Creatinine, Urine 170.00 mg/dL   Total Protein, Urine 74 mg/dL   Protein Creatinine Ratio 0.44 (H) 0.00 - 0.15 mg/mg[Cre]  Comprehensive metabolic panel     Status: Abnormal   Collection Time: 11/04/16  5:29 PM  Result Value Ref Range   Sodium 135 135 - 145 mmol/L   Potassium 4.2 3.5 - 5.1 mmol/L   Chloride 106 101 - 111 mmol/L   CO2 20 (L) 22 - 32 mmol/L   Glucose, Bld 99 65 - 99 mg/dL   BUN 11 6 - 20 mg/dL   Creatinine, Ser 9.56 0.44 - 1.00 mg/dL   Calcium 9.0 8.9 - 21.3 mg/dL   Total Protein 7.2 6.5 - 8.1 g/dL   Albumin 3.1 (L) 3.5 - 5.0 g/dL   AST 16 15 - 41 U/L   ALT 12 (L) 14 - 54 U/L   Alkaline Phosphatase 77 38 - 126 U/L   Total Bilirubin 0.6 0.3 - 1.2 mg/dL   GFR calc non Af Amer >60 >60 mL/min   GFR calc Af Amer >60 >60 mL/min   Anion gap 9 5 - 15  CBC     Status: Abnormal   Collection Time: 11/04/16  5:29 PM  Result Value Ref Range   WBC 13.1 (H) 4.0 - 10.5 K/uL   RBC 5.33 (H) 3.87 - 5.11 MIL/uL   Hemoglobin 11.1 (L) 12.0 - 15.0 g/dL   HCT 08.6 57.8 - 46.9 %   MCV 68.5 (L) 78.0 - 100.0 fL   MCH 20.8 (L) 26.0 - 34.0 pg   MCHC 30.4 30.0 - 36.0 g/dL   RDW 62.9 (H) 52.8 - 41.3 %   Platelets 360 150 - 400 K/uL  Fibrinogen     Status: None   Collection Time: 11/04/16  5:29 PM  Result Value Ref Range   Fibrinogen 459 210 - 475 mg/dL   Hemoglobin K4M     Status: Abnormal   Collection Time: 11/04/16  5:29 PM  Result Value Ref Range   Hgb A1c MFr Bld 5.7 (H) 4.8 - 5.6 %   Mean Plasma Glucose 117 mg/dL  TSH     Status: None   Collection Time: 11/04/16  5:29 PM  Result Value Ref Range   TSH 2.661 0.350 - 4.500 uIU/mL  RPR     Status: None   Collection Time: 11/04/16  6:42 PM  Result Value Ref Range   RPR Ser Ql Non Reactive Non Reactive  Type and screen [facility]      Status: None   Collection Time: 11/04/16  6:42 PM  Result Value Ref Range   ABO/RH(D) O POS  Antibody Screen NEG    Sample Expiration 11/07/2016   CBC     Status: Abnormal   Collection Time: 11/05/16  3:01 PM  Result Value Ref Range   WBC 15.1 (H) 4.0 - 10.5 K/uL   RBC 4.91 3.87 - 5.11 MIL/uL   Hemoglobin 10.3 (L) 12.0 - 15.0 g/dL   HCT 40.9 (L) 81.1 - 91.4 %   MCV 68.8 (L) 78.0 - 100.0 fL   MCH 21.0 (L) 26.0 - 34.0 pg   MCHC 30.5 30.0 - 36.0 g/dL   RDW 78.2 (H) 95.6 - 21.3 %   Platelets 314 150 - 400 K/uL   Will check EKG

## 2016-11-05 NOTE — Progress Notes (Signed)
Patient ID: Stephanie [name], female   DOB: 1985-08-24, 31 y.o.   MRN: [id number] Resting intermittently  Vitals:   11/05/16 1001 11/05/16 1031 11/05/16 1101 11/05/16 1131  BP: (!) 142/76 (!) 143/96 (!) 148/92 (!) 126/95  Pulse: 64 81 84 70  Resp: Temp:      TempSrc:      SpO2:      Weight:      Height:        RN just placed another cytotec  States other pills were in vagina, mostly undissolved  Will change to PO dose next time

## 2016-11-05 NOTE — Anesthesia Postprocedure Evaluation (Signed)
Anesthesia Post Note  Patient: Stephanie [name]  Procedure(s) Performed: * No procedures listed *  Patient location during evaluation: Mother Baby Anesthesia Type: Epidural Level of consciousness: awake and alert Pain management: satisfactory to patient Vital Signs Assessment: post-procedure vital signs reviewed and stable Respiratory status: respiratory function stable Cardiovascular status: stable Postop Assessment: no headache, no backache, epidural receding, patient able to bend at knees, no signs of nausea or vomiting and adequate PO intake Anesthetic complications: no        Last Vitals:  Vitals:   11/05/16 1835 11/05/16 2012  BP: 137/87 122/60  Pulse: 74 82  Resp: 18 20  Temp: 36.7 C 36.6 C    Last Pain:  Vitals:   11/05/16 2012  TempSrc: Oral  PainSc:    Pain Goal:                 Raun Routh

## 2016-11-05 NOTE — Progress Notes (Signed)
I worked with pt during her previous delivery of her 41 month old when her SO was incarcerated and she was alone for an unexpected delivery.  She recognized me immediately and we were able to process some of her emotions and shock as she makes sense out of her grief.  She was very tearful and seemed to be focused on what the "right" way to grieve is.  I normalized this, and also encouraged her to keep in mind that there is not a "right" way to grieve.  She did state that she copes by sleeping a lot and she is aware that she will not be able to do this while caring for her son, Gearlean Alf.  She is concerned that her fiance, who is in recovery from drug addiction, will cope in unhealthy ways.  He went to work today because he felt too emotional to be here.  Her mother is with her for the time being and her son is with a friend.  We will continue to check in on her throughout the day, but please page as needs arise.  Chaplain Dyanne Carrel, Bcc Pager, [phone] 10:25 AM    11/05/16 1000  Clinical Encounter Type  Visited With Patient;Patient and family together  Visit Type Spiritual support  Referral From Nurse;Physician  Spiritual Encounters  Spiritual Needs Emotional;Grief support  Stress Factors  Patient Stress Factors Loss

## 2016-11-05 NOTE — Progress Notes (Signed)
CNM notified of pt c/o diaphoretic, hot, dizzy. VSS, afebrile. Per CNM this is likely due to large dose of cytotec.

## 2016-11-06 MED ORDER — ACETAMINOPHEN 325 MG PO TABS: 650.0000 mg | ORAL_TABLET | ORAL | 0 refills | Status: DC | PRN

## 2016-11-06 MED ORDER — IBUPROFEN 600 MG PO TABS: 600.0000 mg | ORAL_TABLET | Freq: Four times a day (QID) | ORAL | 0 refills | Status: DC

## 2016-11-06 MED ORDER — HYDROCHLOROTHIAZIDE 25 MG PO TABS: 25.0000 mg | ORAL_TABLET | Freq: Every day | ORAL | 1 refills | Status: DC

## 2016-11-06 NOTE — Progress Notes (Signed)
Pt discharged with printed instructions. Pt verbalized an understanding. No concerns noted. Darrel Gloss L Blake Goya, RN 

## 2016-11-06 NOTE — Progress Notes (Signed)
Dr. Doristine Counter at bedside. Dr. Doristine Counter is aware of blood pressures. Carmelina Dane, RN

## 2016-11-06 NOTE — Consult Note (Signed)
Lactation note: Visited mom following loss of her baby.  Written information about Lactation After Loss given and reviewed.  Also provided her with [facility] after loss information.  Mom quiet and thanked me.  Phone number provided to call if any questions/concerns arise.

## 2016-11-06 NOTE — Discharge Summary (Signed)
OB Discharge Summary     Patient Name: Stephanie [name] DOB: [birth year] MRN: [id number]  Date of admission: 11/04/2016 Delivering MD: Aviva Signs   Date of discharge: 11/06/2016  Admitting diagnosis: IUFD Intrauterine pregnancy: [redacted]w[redacted]d     Secondary diagnosis:  Active Problems:   IUFD at 20 weeks or more of gestation  Additional problems: pre-eclampsia without severe features     Discharge diagnosis: IUFD delivered                                                                                                Post partum procedures:none  Augmentation: Cytotec  Complications: None  Hospital course:  Induction of Labor With Vaginal Delivery   31 y.o. yo [phone] at [redacted]w[redacted]d was admitted to the hospital 11/04/2016 for induction of labor.  Indication for induction: IUFD.  Patient had an uncomplicated labor course as follows: Membrane Rupture Time/Date: 1:29 PM ,11/05/2016   Intrapartum Procedures: Episiotomy: None [1]                                         Lacerations:  None [1]  Patient had delivery of a Non Viable infant.  Information for the patient's newborn:  Cherlynn June [[id number]]  Delivery Method: VBAC, Spontaneous (Filed from Delivery Summary)   11/05/2016  Details of delivery can be found in separate delivery note.  Patient had a routine postpartum course. Patient is discharged home 11/06/16.  Physical exam  Vitals:   11/06/16 0400 11/06/16 0821 11/06/16 1131 11/06/16 1159  BP: 128/68 (!) 144/80 (!) 144/112 (!) 141/90  Pulse: 84 78  80  Resp: Temp: 97.7 F (36.5 C) 98.3 F (36.8 C) 97.9 F (36.6 C)   TempSrc: Oral Oral Oral   SpO2: 97% 98% 99% 96%  Weight:      Height:       General: alert, cooperative and no distress Lochia: appropriate Uterine Fundus: firm Incision: N/A DVT Evaluation: No evidence of DVT seen on physical exam. No significant calf/ankle edema. Labs: Lab Results  Component Value Date   WBC 15.1 (H) 11/05/2016   HGB 10.3 (L) 11/05/2016   HCT 33.8 (L) 11/05/2016   MCV 68.8 (L) 11/05/2016   PLT 314 11/05/2016   CMP Latest Ref Rng & Units 11/04/2016  Glucose 65 - 99 mg/dL 99  BUN 6 - 20 mg/dL 11  Creatinine 9.62 - 9.52 mg/dL 8.41  Sodium 324 - 401 mmol/L 135  Potassium 3.5 - 5.1 mmol/L 4.2  Chloride 101 - 111 mmol/L 106  CO2 22 - 32 mmol/L 20(L)  Calcium 8.9 - 10.3 mg/dL 9.0  Total Protein 6.5 - 8.1 g/dL 7.2  Total Bilirubin 0.3 - 1.2 mg/dL 0.6  Alkaline Phos 38 - 126 U/L 77  AST 15 - 41 U/L 16  ALT 14 - 54 U/L 12(L)    Discharge instruction: per After Visit Summary and "Baby and Me Booklet".  After visit meds:  Allergies as of 11/06/2016  No Known Allergies     Medication List    STOP taking these medications   labetalol 200 MG tablet Commonly known as:  NORMODYNE     TAKE these medications   acetaminophen 325 MG tablet Commonly known as:  TYLENOL Take 2 tablets (650 mg total) by mouth every 4 (four) hours as needed (for pain scale < 4).   hydrochlorothiazide 25 MG tablet Commonly known as:  HYDRODIURIL Take 1 tablet (25 mg total) by mouth daily.   ibuprofen 600 MG tablet Commonly known as:  ADVIL,MOTRIN Take 1 tablet (600 mg total) by mouth every 6 (six) hours.       Diet: routine diet  Activity: Advance as tolerated. Pelvic rest for 6 weeks.   Outpatient follow up:4 wks Follow up Appt:No future appointments. Follow up Visit:No Follow-up on file.  Postpartum contraception: Not Discussed  Newborn Data: Non-viable   11/06/2016 Ernestina Penna, MD

## 2016-11-06 NOTE — Progress Notes (Signed)
Post Partum Day 1 Subjective: no complaints, voiding and tolerating PO  Headache resolved  Objective: Blood pressure 128/68, pulse 84, temperature 97.7 F (36.5 C), temperature source Oral, resp. rate 18, height  (1.676 m), weight 262 lb (118.8 kg), last menstrual period 05/06/2016, SpO2 97 %, unknown if currently breastfeeding.  Physical Exam:  General: alert, cooperative and no distress Lochia: appropriate Uterine Fundus: firm Incision:  DVT Evaluation: No evidence of DVT seen on physical exam.   Recent Labs  11/04/16 1729 11/05/16 1501  HGB 11.1* 10.3*  HCT 36.5 33.8*    Assessment/Plan: Evaluate for discharge later today after patient gets up and gets around a little    LOS: 2 days   EURE,LUTHER H 11/06/2016, 7:54 AM

## 2016-11-11 ENCOUNTER — Ambulatory Visit (HOSPITAL_COMMUNITY): Payer: Medicaid Other

## 2016-11-11 ENCOUNTER — Encounter (HOSPITAL_COMMUNITY): Payer: Self-pay

## 2016-12-18 ENCOUNTER — Ambulatory Visit: Payer: Medicaid Other | Admitting: Obstetrics and Gynecology

## 2016-12-18 ENCOUNTER — Ambulatory Visit (INDEPENDENT_AMBULATORY_CARE_PROVIDER_SITE_OTHER): Payer: Medicaid Other | Admitting: Obstetrics and Gynecology

## 2016-12-18 ENCOUNTER — Encounter: Payer: Self-pay | Admitting: Obstetrics and Gynecology

## 2016-12-18 VITALS — BP 126/85 | HR 74 | Resp 18 | Ht 66.0 in | Wt 255.0 lb

## 2016-12-18 DIAGNOSIS — O99345 Other mental disorders complicating the puerperium: Principal | ICD-10-CM

## 2016-12-18 DIAGNOSIS — F53 Postpartum depression: Secondary | ICD-10-CM

## 2016-12-18 MED ORDER — ZOLPIDEM TARTRATE 5 MG PO TABS: 5.0000 mg | ORAL_TABLET | Freq: Every evening | ORAL | 3 refills | Status: DC | PRN

## 2016-12-18 MED ORDER — SERTRALINE HCL 50 MG PO TABS: 50.0000 mg | ORAL_TABLET | Freq: Every day | ORAL | 3 refills | Status: AC

## 2016-12-18 NOTE — Progress Notes (Signed)
Subjective:     Stephanie [name] is a 31 y.o. female who presents for a postpartum visit. She is 6 weeks postpartum following a spontaneous vaginal delivery of a non viable infant. I have fully reviewed the prenatal and intrapartum course. The delivery was at 25 gestational weeks. Outcome: vaginal birth after cesarean (VBAC). Anesthesia: epidural. Postpartum course has been complicated with depression. Bleeding no bleeding. Bowel function is normal. Bladder function is normal. Patient is sexually active. Contraception method is none. Postpartum depression screening: positive.    Review of Systems Pertinent items are noted in HPI.   Objective:    There were no vitals taken for this visit.  General:  alert, cooperative and no distress   Breasts:  inspection negative, no nipple discharge or bleeding, no masses or nodularity palpable  Lungs: clear to auscultation bilaterally  Heart:  regular rate and rhythm  Abdomen: soft, non-tender; bowel sounds normal; no masses,  no organomegaly   Vulva:  normal  Vagina: normal vagina  Cervix:  multiparous appearance  Corpus: normal size, contour, position, consistency, mobility, non-tender  Adnexa:  no mass, fullness, tenderness  Rectal Exam: Not performed.        Assessment:    postpartum exam complicated by depression. Pap smear not done at today's visit.   Plan:    1. Contraception: Depo-Provera injections. Patient will return in 2 weeks  2. Patient is medically cleared to resume all activities of daily living. [facility] Rx Zoloft and ambien provided 3. Follow up in: 2 weeks for depo-provera  or as needed.

## 2016-12-18 NOTE — Progress Notes (Signed)
Erroneous encounter. See pp visit note

## 2016-12-18 NOTE — Progress Notes (Deleted)
Subjective:     Stephanie [name] is a 31 y.o. female who presents for a postpartum visit. She is 6 weeks postpartum following a spontaneous vaginal delivery of a non viable infant. I have fully reviewed the prenatal and intrapartum course. The delivery was at 25 gestational weeks. Outcome: vaginal birth after cesarean (VBAC). Anesthesia: epidural. Postpartum course has been ***. Bleeding {vag bleed:12292}. Bowel function is normal. Bladder function is normal. Patient {is/is not:9024} sexually active. Contraception method is {contraceptive method:5051}. Postpartum depression screening: {neg default:13464::"negative"}.     Review of Systems Pertinent items are noted in HPI.   Objective:    There were no vitals taken for this visit.  General:  alert, cooperative and no distress   Breasts:  inspection negative, no nipple discharge or bleeding, no masses or nodularity palpable  Lungs: clear to auscultation bilaterally  Heart:  regular rate and rhythm  Abdomen: soft, non-tender; bowel sounds normal; no masses,  no organomegaly   Vulva:  normal  Vagina: normal vagina  Cervix:  multiparous appearance  Corpus: normal size, contour, position, consistency, mobility, non-tender  Adnexa:  no mass, fullness, tenderness  Rectal Exam: Not performed.        Assessment:    *** postpartum exam. Pap smear not done at today's visit.   Plan:    1. Contraception: {method:5051} 2. Patient is medically cleared to resume all activities of daily living 3. Follow up in: {1-10:13787} {time; units:19136} or as needed.

## 2016-12-19 ENCOUNTER — Telehealth: Payer: Self-pay | Admitting: Radiology

## 2016-12-19 NOTE — Telephone Encounter (Signed)
Called to inform pt of appointment scheduled for [facility]16 @ 11:00am, unable to leave voicemail on number patient provided [phone], mailbox was not set up

## 2016-12-23 ENCOUNTER — Institutional Professional Consult (permissible substitution): Payer: Medicaid Other

## 2016-12-23 NOTE — BH Specialist Note (Deleted)
Integrated Behavioral Health Initial Visit  MRN: [id number] Name: Bonita QuinKrystin N Dillon   Session Start time: *** Session End time: *** Total time: {IBH Total Time:21014050}  Type of Service: Integrated Behavioral Health- Individual/Family Interpretor:No. Interpretor Name and Language: n/a   Warm Hand Off Completed.       SUBJECTIVE: Bonita QuinKrystin N Dillon is a 31 y.o. female accompanied by {Persons; PED relatives w/patient:19415}. Patient was referred by Dr Jolayne Pantheronstant for depression postpartum. Patient reports the following symptoms/concerns: *** Duration of problem: ***; Severity of problem: {Mild/Moderate/Severe:20260}  OBJECTIVE: Mood: {BHH MOOD:22306} and Affect: {BHH AFFECT:22307} Risk of harm to self or others: {CHL AMB BH Suicide Current Mental Status:21022748}   LIFE CONTEXT: Family and Social: *** School/Work: *** Self-Care: *** Life Changes: Recent childbirth ***  GOALS ADDRESSED: Patient will reduce symptoms of: {IBH Symptoms:21014056} and increase knowledge and/or ability of: {IBH Patient Tools:21014057} and also: {IBH Goals:21014053}   INTERVENTIONS: {IBH Interventions:21014054}  Standardized Assessments completed: GAD-7 and PHQ 9  ASSESSMENT: Patient currently experiencing ***. Patient may benefit from ***.  PLAN: 1. Follow up with behavioral health clinician on : *** 2. Behavioral recommendations: *** 3. Referral(s): {IBH Referrals:21014055}  Ziad Maye C Nyheem Binette, LCSWA

## 2016-12-24 ENCOUNTER — Telehealth: Payer: Self-pay | Admitting: Clinical

## 2016-12-24 NOTE — Telephone Encounter (Signed)
[facility], no voicemail set up, no message left.

## 2017-01-01 ENCOUNTER — Ambulatory Visit (INDEPENDENT_AMBULATORY_CARE_PROVIDER_SITE_OTHER): Payer: Medicaid Other | Admitting: *Deleted

## 2017-01-01 DIAGNOSIS — Z3042 Encounter for surveillance of injectable contraceptive: Secondary | ICD-10-CM | POA: Diagnosis not present

## 2017-01-01 DIAGNOSIS — Z3202 Encounter for pregnancy test, result negative: Secondary | ICD-10-CM

## 2017-01-01 DIAGNOSIS — Z30013 Encounter for initial prescription of injectable contraceptive: Secondary | ICD-10-CM

## 2017-01-01 LAB — POCT URINE PREGNANCY: PREG TEST UR: NEGATIVE

## 2017-01-01 MED ORDER — MEDROXYPROGESTERONE ACETATE 150 MG/ML IM SUSP
150.0000 mg | INTRAMUSCULAR | Status: DC
  Administered 2017-01-01: 150 mg via INTRAMUSCULAR

## 2017-01-01 MED ORDER — MEDROXYPROGESTERONE ACETATE 150 MG/ML IM SUSP: 150.0000 mg | INTRAMUSCULAR | 3 refills | Status: DC

## 2017-01-01 NOTE — Progress Notes (Signed)
Pt is here today for Depo Provera 150mg  injection for contraception.  Pt is currently on her menstrual cycle, UPT negative.  Depo Provera 150mg  given.  Pt will return at next due injection time.

## 2017-03-19 ENCOUNTER — Ambulatory Visit: Payer: Medicaid Other | Admitting: *Deleted

## 2017-05-11 IMAGING — CR DG CHEST 1V PORT
1 series · 1 of 1 positions shown · non-contrast
Comparison: Earlier today at 3624 hours

CLINICAL DATA: Pulmonary edema.

EXAM:
PORTABLE CHEST 1 VIEW

[view not recorded]
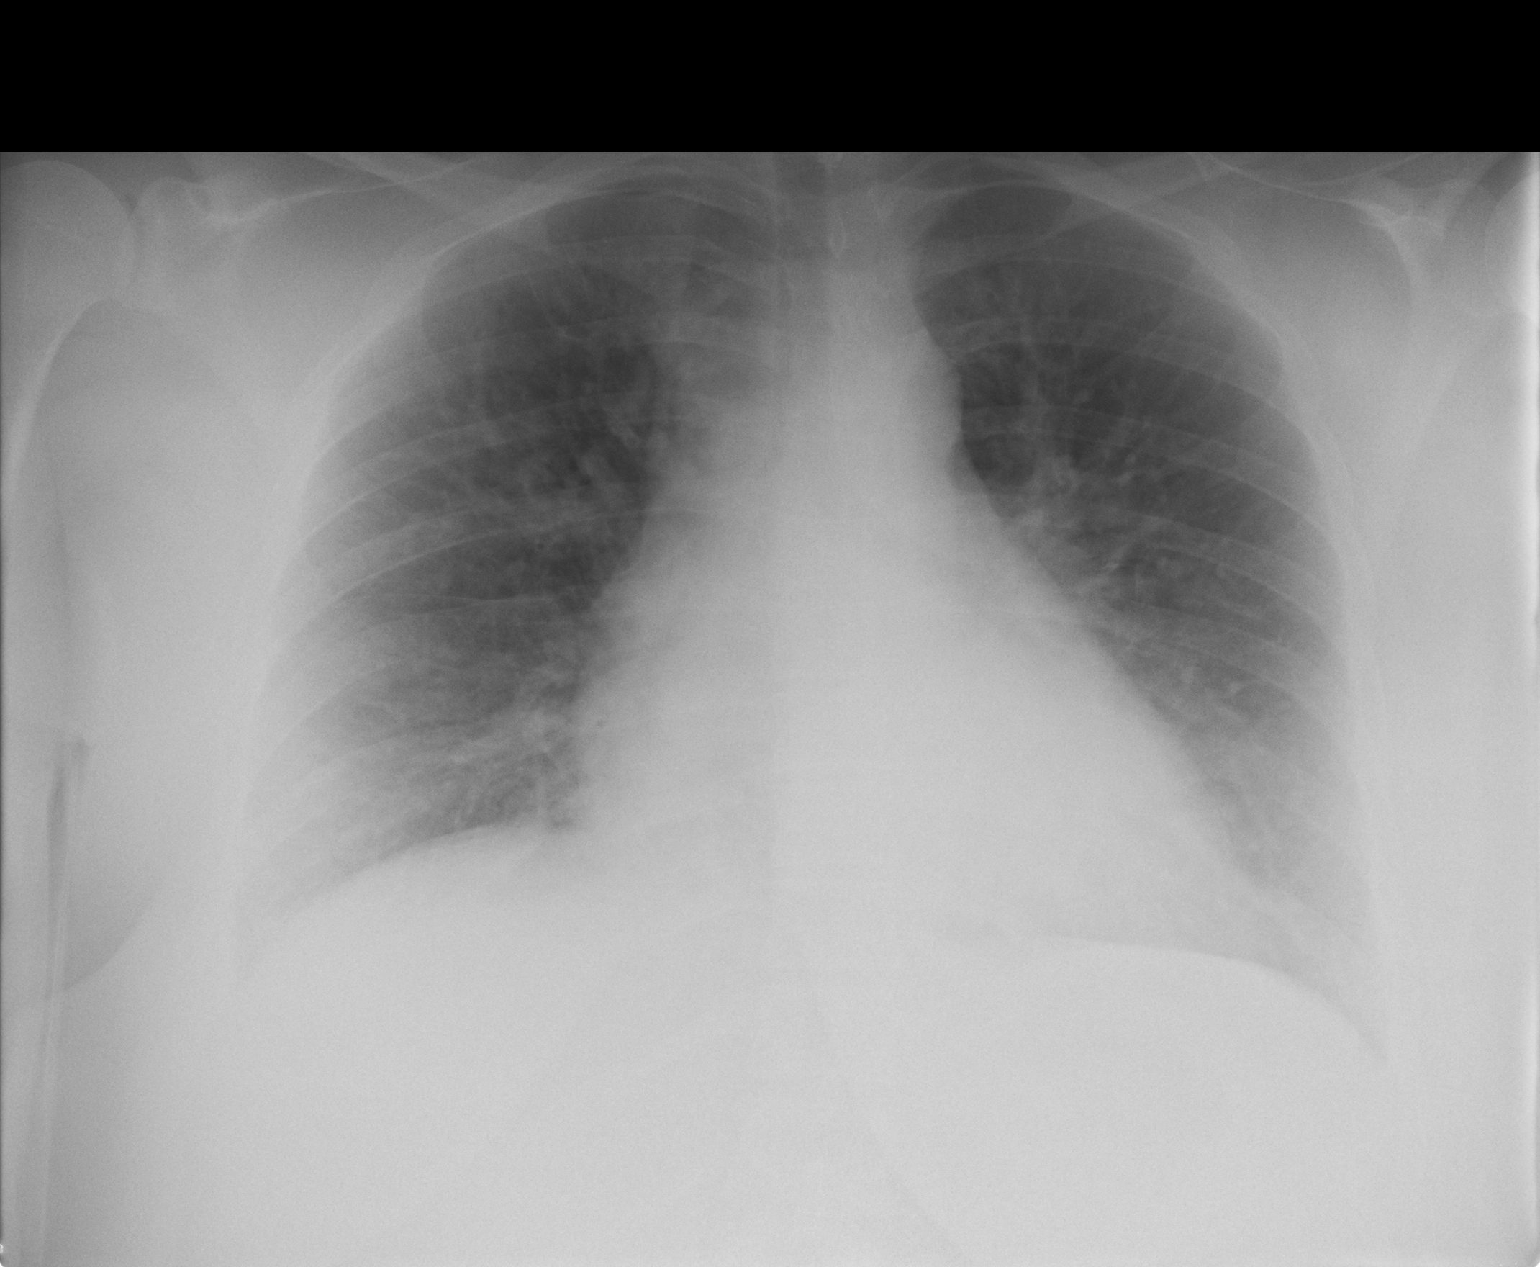

[1 of 1 positions shown; findings below may reference images not displayed]

FINDINGS: 3459 hours. Midline trachea. Cardiomegaly accentuated by AP portable
technique. Mildly degraded exam due to AP portable technique and
patient body habitus. No pleural effusion or pneumothorax. Low lung
volumes. Mild pulmonary interstitial prominence is favored to be
secondary. No overt congestive failure. No lobar consolidation.
IMPRESSION: Cardiomegaly and low lung volumes. No convincing evidence of acute
superimposed disease.

These results will be called to the ordering clinician or
representative by the Radiologist Assistant, and communication
documented in the PACS or zVision Dashboard.

## 2018-04-24 ENCOUNTER — Other Ambulatory Visit: Payer: Self-pay

## 2018-04-24 ENCOUNTER — Encounter: Payer: Self-pay | Admitting: Emergency Medicine

## 2018-04-24 ENCOUNTER — Emergency Department
Admission: EM | Admit: 2018-04-24 | Discharge: 2018-04-25 | Disposition: A | Discharge status: Home / Self Care | Payer: Medicaid Other | Attending: Student in an Organized Health Care Education/Training Program | Admitting: Student in an Organized Health Care Education/Training Program

## 2018-04-24 ENCOUNTER — Emergency Department: Payer: Medicaid Other

## 2018-04-24 DIAGNOSIS — O10011 Pre-existing essential hypertension complicating pregnancy, first trimester: Secondary | ICD-10-CM | POA: Diagnosis not present

## 2018-04-24 DIAGNOSIS — Z3A01 Less than 8 weeks gestation of pregnancy: Secondary | ICD-10-CM | POA: Insufficient documentation

## 2018-04-24 DIAGNOSIS — Z79899 Other long term (current) drug therapy: Secondary | ICD-10-CM | POA: Diagnosis not present

## 2018-04-24 DIAGNOSIS — O26891 Other specified pregnancy related conditions, first trimester: Secondary | ICD-10-CM | POA: Insufficient documentation

## 2018-04-24 DIAGNOSIS — R1031 Right lower quadrant pain: Secondary | ICD-10-CM | POA: Diagnosis not present

## 2018-04-24 LAB — COMPREHENSIVE METABOLIC PANEL
ALK PHOS: 49 U/L (ref 38–126)
ALT: 21 U/L (ref 0–44)
ANION GAP: 8 (ref 5–15)
AST: 17 U/L (ref 15–41)
Albumin: 4 g/dL (ref 3.5–5.0)
BUN: 9 mg/dL (ref 6–20)
CO2: 25 mmol/L (ref 22–32)
Calcium: 9.1 mg/dL (ref 8.9–10.3)
Chloride: 105 mmol/L (ref 98–111)
Creatinine, Ser: 0.73 mg/dL (ref 0.44–1.00)
GFR calc Af Amer: >60 mL/min (ref >60)
Glucose, Bld: 94 mg/dL (ref 70–99)
POTASSIUM: 3.4 mmol/L — AB (ref 3.5–5.1)
SODIUM: 138 mmol/L (ref 135–145)
Total Bilirubin: 0.3 mg/dL (ref 0.3–1.2)
Total Protein: 7.3 g/dL (ref 6.5–8.1)

## 2018-04-24 LAB — CBC
HEMATOCRIT: 38.4 % (ref 35.0–47.0)
HEMOGLOBIN: 12.6 g/dL (ref 12.0–16.0)
MCH: 23.3 pg — ABNORMAL LOW (ref 26.0–34.0)
MCHC: 32.9 g/dL (ref 32.0–36.0)
MCV: 70.9 fL — ABNORMAL LOW (ref 80.0–100.0)
Platelets: 366 10*3/uL (ref 150–440)
RBC: 5.41 MIL/uL — AB (ref 3.80–5.20)
RDW: 16.3 % — ABNORMAL HIGH (ref 11.5–14.5)
WBC: 13.5 10*3/uL — AB (ref 3.6–11.0)

## 2018-04-24 LAB — URINALYSIS, COMPLETE (UACMP) WITH MICROSCOPIC
Bilirubin Urine: NEGATIVE
GLUCOSE, UA: NEGATIVE mg/dL
HGB URINE DIPSTICK: NEGATIVE
KETONES UR: NEGATIVE mg/dL
NITRITE: NEGATIVE
PH: 5 (ref 5.0–8.0)
PROTEIN: NEGATIVE mg/dL
Specific Gravity, Urine: 1.027 (ref 1.005–1.030)

## 2018-04-24 LAB — POCT PREGNANCY, URINE: Preg Test, Ur: POSITIVE — AB

## 2018-04-24 LAB — HCG, QUANTITATIVE, PREGNANCY: hCG, Beta Chain, Quant, S: 788 m[IU]/mL — ABNORMAL HIGH (ref <5)

## 2018-04-24 LAB — LIPASE, BLOOD: LIPASE: 28 U/L (ref 11–51)

## 2018-04-24 MED ORDER — CEPHALEXIN 500 MG PO CAPS: 500.0000 mg | ORAL_CAPSULE | Freq: Three times a day (TID) | ORAL | 0 refills | Status: AC

## 2018-04-24 MED ORDER — ACETAMINOPHEN 500 MG PO TABS
1000.0000 mg | ORAL_TABLET | Freq: Once | ORAL | Status: AC
  Administered 2018-04-24: 1000 mg via ORAL
  Filled 2018-04-24: qty 2

## 2018-04-24 MED ORDER — SODIUM CHLORIDE 0.9 % IV BOLUS
1000.0000 mL | Freq: Once | INTRAVENOUS | Status: AC
  Administered 2018-04-24: 1000 mL via INTRAVENOUS

## 2018-04-24 MED ORDER — ONDANSETRON HCL 4 MG/2ML IJ SOLN
4.0000 mg | Freq: Once | INTRAMUSCULAR | Status: AC
  Administered 2018-04-24: 4 mg via INTRAVENOUS
  Filled 2018-04-24: qty 2

## 2018-04-24 MED ORDER — FENTANYL CITRATE (PF) 100 MCG/2ML IJ SOLN
25.0000 ug | INTRAMUSCULAR | Status: DC | PRN
  Administered 2018-04-24 – 2018-04-25 (×2): 25 ug via INTRAVENOUS
  Filled 2018-04-24 (×2): qty 2

## 2018-04-24 MED ORDER — MORPHINE SULFATE (PF) 4 MG/ML IV SOLN: 4.0000 mg | INTRAVENOUS | Status: DC | PRN

## 2018-04-24 NOTE — ED Triage Notes (Signed)
Pt ambulatory to triage with no difficulty. Pt reports pain to her abd from her umbilical to her right side that started yesterday. Pt reports + nausea, no vomiting. Reports also has been constipated. Pt also reports she is 5 days late with her period.

## 2018-04-24 NOTE — ED Provider Notes (Signed)
[facility] Emergency Department Provider Note    First MD Initiated Contact with Patient 04/24/18 2041     (approximate)  I have reviewed the triage vital signs and the nursing notes.   HISTORY  Chief Complaint Abdominal Pain    HPI Stephanie [name] is a 32 y.o. female presents the ER with chief complaint of abdominal pain.  States that initially started in the periumbilical region and then started radiating down to the right lower quadrant started yesterday.  Denies any flank pain.  Has had some nausea but no vomiting.  Says she is 5 days late on her menstrual cycle.  Denies any vaginal bleeding or discharge.  No fainting spells.  No previous abdominal surgeries.  Has had some decreased oral intake and anorexia today.  No measured fevers or chills.    Past Medical History:  Diagnosis Date  . Headache   . Hypertension   . Hypothyroidism    resolved  . Preeclampsia   . Preterm labor 2009   [redacted] wksga  . Preterm labor 54098   [redacted] wksga  . Pulmonary edema    third trimester  . Thrombophlebitis    Family History  Problem Relation Age of Onset  . Hypertension Father   . Diabetes Maternal Grandmother    Past Surgical History:  Procedure Laterality Date  . CESAREAN SECTION    . CESAREAN SECTION N/A 01/04/2016   Procedure: CESAREAN SECTION;  Surgeon: Silverio Lay, MD;  Location: [facility] BIRTHING SUITES;  Service: Obstetrics;  Laterality: N/A;   Patient Active Problem List   Diagnosis Date Noted  . IUFD at 20 weeks or more of gestation 11/04/2016  . Chronic hypertension during pregnancy, antepartum 09/24/2016  . Supervision of high risk pregnancy, antepartum 07/18/2016  . History of gestational diabetes mellitus (GDM) 07/18/2016  . Status post repeat low transverse cesarean section--due to severe pre-eclampsia 01/05/2016  . Hx of preeclampsia, prior pregnancy, currently pregnant 01/02/2016  . H/O placenta previa 11/29/2015  . H/O: C-section  11/29/2015  . H/O migraine (no meds) 11/29/2015  . Obesity (BMI 30-39.9) 11/29/2015  . Rubella non-immune status, antepartum 11/29/2015  . H/O gastroesophageal reflux (GERD) 11/29/2015  . Restless leg syndrome 11/29/2015      Prior to Admission medications   Medication Sig Start Date End Date Taking? Authorizing Provider  cephALEXin (KEFLEX) 500 MG capsule Take 1 capsule (500 mg total) by mouth 3 (three) times daily for 7 days. 04/24/18 05/01/18  Willy Eddy, MD  medroxyPROGESTERone (DEPO-PROVERA) 150 MG/ML injection Inject 1 mL (150 mg total) into the muscle every 3 (three) months. 01/01/17   Constant, Peggy, MD  sertraline (ZOLOFT) 50 MG tablet Take 1 tablet (50 mg total) by mouth daily. 12/18/16   Constant, Peggy, MD  topiramate (TOPAMAX) 25 MG tablet Take 25 mg by mouth daily.    [provider]    Allergies Patient has no known allergies.    Social History Social History   Tobacco Use  . Smoking status: Never Smoker  . Smokeless tobacco: Never Used  Substance Use Topics  . Alcohol use: No  . Drug use: No    Review of Systems Patient denies headaches, rhinorrhea, blurry vision, numbness, shortness of breath, chest pain, edema, cough, abdominal pain, nausea, vomiting, diarrhea, dysuria, fevers, rashes or hallucinations unless otherwise stated above in HPI. ____________________________________________   PHYSICAL EXAM:  VITAL SIGNS: Vitals:   04/24/18 2023 04/24/18 2130  BP: 130/82 139/83  Pulse: 77 75  Resp:  18  Temp: 98.4 F (36.9 C)   SpO2: 100% 100%    Constitutional: Alert and oriented.  Eyes: Conjunctivae are normal.  Head: Atraumatic. Nose: No congestion/rhinnorhea. Mouth/Throat: Mucous membranes are moist.   Neck: No stridor. Painless ROM.  Cardiovascular: Normal rate, regular rhythm. Grossly normal heart sounds.  Good peripheral circulation. Respiratory: Normal respiratory effort.  No retractions. Lungs CTAB. Gastrointestinal: Soft with  ttp in RLQ. No distention. No abdominal bruits. No CVA tenderness. Genitourinary: deferred Musculoskeletal: No lower extremity tenderness nor edema.  No joint effusions. Neurologic:  Normal speech and language. No gross focal neurologic deficits are appreciated. No facial droop Skin:  Skin is warm, dry and intact. No rash noted. Psychiatric: Mood and affect are normal. Speech and behavior are normal.  ____________________________________________   LABS (all labs ordered are listed, but only abnormal results are displayed)  Results for orders placed or performed during the hospital encounter of 04/24/18 (from the past 24 hour(s))  Lipase, blood     Status: None   Collection Time: 04/24/18  8:37 PM  Result Value Ref Range   Lipase 28 11 - 51 U/L  Comprehensive metabolic panel     Status: Abnormal   Collection Time: 04/24/18  8:37 PM  Result Value Ref Range   Sodium 138 135 - 145 mmol/L   Potassium 3.4 (L) 3.5 - 5.1 mmol/L   Chloride 105 98 - 111 mmol/L   CO2 25 22 - 32 mmol/L   Glucose, Bld 94 70 - 99 mg/dL   BUN 9 6 - 20 mg/dL   Creatinine, Ser 1.61 0.44 - 1.00 mg/dL   Calcium 9.1 8.9 - 09.6 mg/dL   Total Protein 7.3 6.5 - 8.1 g/dL   Albumin 4.0 3.5 - 5.0 g/dL   AST 17 15 - 41 U/L   ALT 21 0 - 44 U/L   Alkaline Phosphatase 49 38 - 126 U/L   Total Bilirubin 0.3 0.3 - 1.2 mg/dL   GFR calc non Af Amer >60 >60 mL/min   GFR calc Af Amer >60 >60 mL/min   Anion gap 8 5 - 15  CBC     Status: Abnormal   Collection Time: 04/24/18  8:37 PM  Result Value Ref Range   WBC 13.5 (H) 3.6 - 11.0 K/uL   RBC 5.41 (H) 3.80 - 5.20 MIL/uL   Hemoglobin 12.6 12.0 - 16.0 g/dL   HCT 04.5 40.9 - 81.1 %   MCV 70.9 (L) 80.0 - 100.0 fL   MCH 23.3 (L) 26.0 - 34.0 pg   MCHC 32.9 32.0 - 36.0 g/dL   RDW 91.4 (H) 78.2 - 95.6 %   Platelets 366 150 - 440 K/uL  Urinalysis, Complete w Microscopic     Status: Abnormal   Collection Time: 04/24/18  8:37 PM  Result Value Ref Range   Color, Urine YELLOW (A)  YELLOW   APPearance HAZY (A) CLEAR   Specific Gravity, Urine 1.027 1.005 - 1.030   pH 5.0 5.0 - 8.0   Glucose, UA NEGATIVE NEGATIVE mg/dL   Hgb urine dipstick NEGATIVE NEGATIVE   Bilirubin Urine NEGATIVE NEGATIVE   Ketones, ur NEGATIVE NEGATIVE mg/dL   Protein, ur NEGATIVE NEGATIVE mg/dL   Nitrite NEGATIVE NEGATIVE   Leukocytes, UA TRACE (A) NEGATIVE   RBC / HPF 11-20 0 - 5 RBC/hpf   WBC, UA 0-5 0 - 5 WBC/hpf   Bacteria, UA RARE (A) NONE SEEN   Squamous Epithelial / LPF 6-10 0 - 5  Mucus PRESENT    Crystals PRESENT (A) NEGATIVE  hCG, quantitative, pregnancy     Status: Abnormal   Collection Time: 04/24/18  8:37 PM  Result Value Ref Range   hCG, Beta Chain, Quant, S 788 (H) <5 mIU/mL  Pregnancy, urine POC     Status: Abnormal   Collection Time: 04/24/18  9:16 PM  Result Value Ref Range   Preg Test, Ur POSITIVE (A) NEGATIVE   ____________________________________________ ____________________________________________  RADIOLOGY  I personally reviewed all radiographic images ordered to evaluate for the above acute complaints and reviewed radiology reports and findings.  These findings were personally discussed with the patient.  Please see medical record for radiology report.  ____________________________________________   PROCEDURES  Procedure(s) performed:  Procedures    Critical Care performed: no ____________________________________________   INITIAL IMPRESSION / ASSESSMENT AND PLAN / ED COURSE  Pertinent labs & imaging results that were available during my care of the patient were reviewed by me and considered in my medical decision making (see chart for details).   DDX: Appendicitis, colitis, IBS, cholelithiasis, ovarian torsion, ectopic, pregnancy, UTI, stone  Stephanie [name] is a 36 y.o. who presents to the ED with symptoms as described above.  Patient afebrile Heema dynamically stable.  Does have mild leukocytosis.  Initial blood work does show evidence  that she is currently pregnant.  Clinically is concerning for appendicitis however on exam however.  Will order ultrasound to rule out evidence of ectopic.  Depending on her clinical course may even require MRI to further evaluate for appendicitis.  Clinical Course as of Apr 24 2302  Sat Apr 24, 2018  2301 Ultrasound is equivocal in early pregnancy.  Given her tenderness on exam will order MRI to further evaluate for appendicitis.  Feel ectopic is less likely.  Discussed need for close follow-up with serial hCGs.  Patient will be signed out to oncoming physician pending results of MRI.  Have discussed with the patient and available family all diagnostics and treatments performed thus far and all questions were answered to the best of my ability. The patient demonstrates understanding and agreement with plan.    [PR]    Clinical Course User Index [PR] Willy Eddy, MD     As part of my medical decision making, I reviewed the following data within the electronic MEDICAL RECORD NUMBER Nursing notes reviewed and incorporated, Labs reviewed, notes from prior ED visits and [place] Controlled Substance Database   ____________________________________________   FINAL CLINICAL IMPRESSION(S) / ED DIAGNOSES  Final diagnoses:  Abdominal pain, RLQ  Less than [redacted] weeks gestation of pregnancy      NEW MEDICATIONS STARTED DURING THIS VISIT:  New Prescriptions   CEPHALEXIN (KEFLEX) 500 MG CAPSULE    Take 1 capsule (500 mg total) by mouth 3 (three) times daily for 7 days.     Note:  This document was prepared using Dragon voice recognition software and may include unintentional dictation errors.    Willy Eddy, MD 04/24/18 2303

## 2018-04-24 NOTE — ED Notes (Signed)
Patient taken to ultrasound.

## 2018-04-24 NOTE — ED Notes (Signed)
Pt agreeable with plan for MRI and the amount of time that it may take.

## 2018-04-25 ENCOUNTER — Emergency Department: Payer: Medicaid Other

## 2018-04-25 MED ORDER — ONDANSETRON 4 MG PO TBDP
4.0000 mg | ORAL_TABLET | Freq: Once | ORAL | Status: AC
  Administered 2018-04-25: 4 mg via ORAL
  Filled 2018-04-25: qty 1

## 2018-04-25 NOTE — ED Notes (Signed)
Water provided to pt.

## 2018-04-25 NOTE — ED Provider Notes (Signed)
-----------------------------------------   6:45 AM on 04/25/2018 -----------------------------------------  Patient care assumed from Dr. Roxan Hockey.  Unfortunately took an extremely long time for the MRI to get read by radiology.  We eventually called radiology as we have not yet received a read on the MRI they were able to give me a verbal read of a normal MRI, with a normal appendix.  Ultrasound is consistent with a 5-week pregnancy which is consistent with the patient's story being late with her period approximately 5 days as well as approximately consistent with her current beta-hCG level.  Patient continues to state a mild right lower quadrant pain.  At this time and is not entirely sure the cause of the patient's discomfort.  I discussed with patient using Tylenol 1000 mg every 6 hours as needed for discomfort giving the pain 2-3 more days to see if it improves or resolves.  If it does not the patient is to return to the emergency department for further evaluation as she does not currently have a primary care physician or insurance.  Patient agreeable to plan of care.  Discussed return precautions.   Minna Antis, MD 04/25/18 [phone]

## 2018-04-25 NOTE — ED Notes (Signed)
Patient transported to MRI 

## 2018-05-31 ENCOUNTER — Other Ambulatory Visit: Payer: Self-pay | Admitting: Certified Nurse Midwife

## 2018-05-31 DIAGNOSIS — Z369 Encounter for antenatal screening, unspecified: Secondary | ICD-10-CM

## 2018-06-17 ENCOUNTER — Ambulatory Visit: Payer: Medicaid Other

## 2018-06-27 ENCOUNTER — Emergency Department
Admission: EM | Admit: 2018-06-27 | Discharge: 2018-06-27 | Disposition: A | Discharge status: Home / Self Care | Payer: Medicaid Other | Attending: Student in an Organized Health Care Education/Training Program | Admitting: Student in an Organized Health Care Education/Training Program

## 2018-06-27 ENCOUNTER — Encounter: Payer: Self-pay | Admitting: Emergency Medicine

## 2018-06-27 DIAGNOSIS — Z8759 Personal history of other complications of pregnancy, childbirth and the puerperium: Secondary | ICD-10-CM | POA: Diagnosis not present

## 2018-06-27 DIAGNOSIS — F329 Major depressive disorder, single episode, unspecified: Secondary | ICD-10-CM | POA: Diagnosis not present

## 2018-06-27 DIAGNOSIS — Z3A14 14 weeks gestation of pregnancy: Secondary | ICD-10-CM | POA: Insufficient documentation

## 2018-06-27 DIAGNOSIS — F32A Depression, unspecified: Secondary | ICD-10-CM

## 2018-06-27 DIAGNOSIS — O162 Unspecified maternal hypertension, second trimester: Secondary | ICD-10-CM | POA: Diagnosis not present

## 2018-06-27 DIAGNOSIS — O9989 Other specified diseases and conditions complicating pregnancy, childbirth and the puerperium: Secondary | ICD-10-CM | POA: Insufficient documentation

## 2018-06-27 DIAGNOSIS — F4323 Adjustment disorder with mixed anxiety and depressed mood: Secondary | ICD-10-CM | POA: Insufficient documentation

## 2018-06-27 DIAGNOSIS — R05 Cough: Secondary | ICD-10-CM

## 2018-06-27 DIAGNOSIS — O99342 Other mental disorders complicating pregnancy, second trimester: Secondary | ICD-10-CM | POA: Insufficient documentation

## 2018-06-27 DIAGNOSIS — R0981 Nasal congestion: Secondary | ICD-10-CM | POA: Diagnosis not present

## 2018-06-27 DIAGNOSIS — Z8659 Personal history of other mental and behavioral disorders: Secondary | ICD-10-CM | POA: Insufficient documentation

## 2018-06-27 DIAGNOSIS — R070 Pain in throat: Secondary | ICD-10-CM | POA: Diagnosis not present

## 2018-06-27 DIAGNOSIS — Z3490 Encounter for supervision of normal pregnancy, unspecified, unspecified trimester: Secondary | ICD-10-CM

## 2018-06-27 DIAGNOSIS — R059 Cough, unspecified: Secondary | ICD-10-CM

## 2018-06-27 DIAGNOSIS — Z658 Other specified problems related to psychosocial circumstances: Secondary | ICD-10-CM | POA: Insufficient documentation

## 2018-06-27 NOTE — ED Notes (Signed)
VOL, SOC called 

## 2018-06-27 NOTE — ED Notes (Signed)
Pt requested to have ultrasound FHR.

## 2018-06-27 NOTE — ED Notes (Signed)
Soc in progress.  

## 2018-06-27 NOTE — ED Notes (Signed)
Report to include Situation, Background, Assessment, and Recommendations received from Wendy RN. Patient alert and oriented, warm and dry, in no acute distress. Patient denies SI, HI, AVH and pain. Patient made aware of Q15 minute rounds and Rover and Officer presence for their safety. Patient instructed to come to me with needs or concerns.  

## 2018-06-27 NOTE — ED Notes (Signed)
Patient refused the labs at this time, Dr. Roxan Hockeyobinson said it was ok [facility]

## 2018-06-27 NOTE — ED Provider Notes (Signed)
Patient very tearful and clearly under overwhelming stress.  I had the patient evaluated by psych.  She is not demonstrating any active SI at this time.  Does not meet any criteria for IVC.  Psychiatry has evaluated patient via tele-psychiatry and felt that she was appropriate for outpatient follow-up which I think is reasonable.  Reportedly working on [employer]ML[employer] paperwork for her work.   Willy Eddyobinson, Jessalynn Mccowan, MD 06/27/18 [phone]

## 2018-06-27 NOTE — ED Triage Notes (Addendum)
Pt to ED with c/o of non productive cough and sore throat. Pt also wanted BP checked. BP 128/81 in triage. Pt states has been high at home. Pt is [redacted] weeks pregnant.

## 2018-06-27 NOTE — ED Provider Notes (Signed)
[facility] Emergency Department Provider Note  ____________________________________________  Time seen: Approximately 4:02 PM  I have reviewed the triage vital signs and the nursing notes.   HISTORY  Chief Complaint Cough and Sore Throat    HPI Stephanie [name] is a 32 y.o. female that is [redacted] weeks pregnant that presents to the emergency department for evaluation of nasal congestion, sore throat, hoarse voice, cough for 1 week.  Patient states that she has had some lower abdominal cramping on and off as well.  Patient states that her son was born at 3331 weeks, her daughter was born at 3328 weeks and she had a miscarriage due to high blood pressure.  Patient states that she is taking labetalol twice a day and this was recently increased.  She has been checking her blood pressure frequently at home and states that she has had several high readings but is not sure what those readings were.  Mother is concerned that patient has had suicidal thoughts.  When asked to patient she states that she does not think that she "would ever do anything about the thoughts." She has recently had some difficulties with her marriage, which has increased her anxiety.   Past Medical History:  Diagnosis Date  . Headache   . Hypertension   . Hypothyroidism    resolved  . Preeclampsia   . Preterm labor 2009   [redacted] wksga  . Preterm labor [id number]   [redacted] wksga  . Pulmonary edema    third trimester  . Thrombophlebitis     Patient Active Problem List   Diagnosis Date Noted  . IUFD at 20 weeks or more of gestation 11/04/2016  . Chronic hypertension during pregnancy, antepartum 09/24/2016  . Supervision of high risk pregnancy, antepartum 07/18/2016  . History of gestational diabetes mellitus (GDM) 07/18/2016  . Status post repeat low transverse cesarean section--due to severe pre-eclampsia 01/05/2016  . Hx of preeclampsia, prior pregnancy, currently pregnant 01/02/2016  . H/O placenta  previa 11/29/2015  . H/O: C-section 11/29/2015  . H/O migraine (no meds) 11/29/2015  . Obesity (BMI 30-39.9) 11/29/2015  . Rubella non-immune status, antepartum 11/29/2015  . H/O gastroesophageal reflux (GERD) 11/29/2015  . Restless leg syndrome 11/29/2015    Past Surgical History:  Procedure Laterality Date  . CESAREAN SECTION    . CESAREAN SECTION N/A 01/04/2016   Procedure: CESAREAN SECTION;  Surgeon: Silverio LaySandra [name], MD;  [facility];  Service: Obstetrics;  Laterality: N/A;    Prior to Admission medications   Medication Sig Start Date End Date Taking? Authorizing Provider  medroxyPROGESTERone (DEPO-PROVERA) 150 MG/ML injection Inject 1 mL (150 mg total) into the muscle every 3 (three) months. Patient not taking: Reported on 04/24/2018 01/01/17   Constant, Peggy, MD  sertraline (ZOLOFT) 50 MG tablet Take 1 tablet (50 mg total) by mouth daily. Patient not taking: Reported on 04/24/2018 12/18/16   Constant, Peggy, MD  topiramate (TOPAMAX) 25 MG tablet Take 25 mg by mouth daily.    [provider]    Allergies Patient has no known allergies.  Family History  Problem Relation Age of Onset  . Hypertension Father   . Diabetes Maternal Grandmother     Social History Social History   Tobacco Use  . Smoking status: Never Smoker  . Smokeless tobacco: Never Used  Substance Use Topics  . Alcohol use: No  . Drug use: No     Review of Systems  Constitutional: No fever/chills Eyes: No visual changes.  No discharge. ENT: Positive for congestion and rhinorrhea. Cardiovascular: No chest pain. Respiratory: Positive for cough. No SOB. Gastrointestinal: No nausea, no vomiting.   Musculoskeletal: Negative for musculoskeletal pain. Skin: Negative for rash, abrasions, lacerations, ecchymosis. Neurological: Negative for headaches.   ____________________________________________   PHYSICAL EXAM:  VITAL SIGNS: ED Triage Vitals  Enc Vitals Group     BP 06/27/18  1520 128/81     Pulse Rate 06/27/18 1520 86     Resp 06/27/18 1520 16     Temp 06/27/18 1520 98.3 F (36.8 C)     Temp Source 06/27/18 1520 Oral     SpO2 06/27/18 1520 97 %     Weight 06/27/18 1521 250 lb (113.4 kg)     Height 06/27/18 1521 5\' 6"  (1.676 m)     Head Circumference --      Peak Flow --      Pain Score 06/27/18 1521 6     Pain Loc --      Pain Edu? --      Excl. in GC? --      Constitutional: Alert and oriented. Well appearing and in no acute distress. Hoarse voice. Eyes: Conjunctivae are normal. PERRL. EOMI. No discharge. Head: Atraumatic. ENT: No frontal and maxillary sinus tenderness.      Ears: Tympanic membranes pearly gray with good landmarks. No discharge.      Nose: Mild congestion/rhinnorhea.      Mouth/Throat: Mucous membranes are moist. Oropharynx non-erythematous. Tonsils not enlarged. No exudates. Uvula midline. Neck: No stridor.   Hematological/Lymphatic/Immunilogical: No cervical lymphadenopathy. Cardiovascular: Normal rate, regular rhythm.  Good peripheral circulation. Respiratory: Normal respiratory effort without tachypnea or retractions. Lungs CTAB. Good air entry to the bases with no decreased or absent breath sounds. Gastrointestinal: Bowel sounds 4 quadrants. Soft and nontender to palpation. No guarding or rigidity. No palpable masses. No distention. Musculoskeletal: Full range of motion to all extremities. No gross deformities appreciated. Neurologic:  Normal speech and language. No gross focal neurologic deficits are appreciated.  Skin:  Skin is warm, dry and intact. No rash noted. Psychiatric: Mood and affect are normal. Speech and behavior are normal. Patient exhibits appropriate insight and judgement.   ____________________________________________   LABS (all labs ordered are listed, but only abnormal results are displayed)  Labs Reviewed - No data to  display ____________________________________________  EKG   ____________________________________________  RADIOLOGY   No results found.  ____________________________________________    PROCEDURES  Procedure(s) performed:    Procedures    Medications - No data to display   ____________________________________________   INITIAL IMPRESSION / ASSESSMENT AND PLAN / ED COURSE  Pertinent labs & imaging results that were available during my care of the patient were reviewed by me and considered in my medical decision making (see chart for details).  Review of the [place] CSRS was performed in accordance of the [facility] prior to dispensing any controlled drugs.   Patient presented to emergency department for evaluation of URI symptoms during pregnancy and concern for high blood pressure.  Patient and mother also expressed that patient has been feeling depressed recently and has had suicidal thoughts.  Patient was discussed with Dr. Roxan Hockey, who is agreement to have patient transferred to main side ED for further evaluation and for psychiatric consult.  Darl Pikes RN was asked to request a main side bed for patient and have her transferred.       ____________________________________________  FINAL CLINICAL IMPRESSION(S) / ED DIAGNOSES     NEW MEDICATIONS STARTED  DURING THIS VISIT:  ED Discharge Orders    None          This chart was dictated using voice recognition software/Dragon. Despite best efforts to proofread, errors can occur which can change the meaning. Any change was purely unintentional.    Enid Derry, PA-C 06/27/18 2136    Willy Eddy, MD 06/27/18 2211

## 2018-06-27 NOTE — ED Notes (Signed)
Dr. Roxan Hockeyobinson talking with Patient.

## 2018-06-27 NOTE — ED Notes (Signed)
Patient is sitting in hallway, she is teary eyed, nurse talked with her and she said she is pregnant and her husband left her today, and has been cheating on her and that she was depressed, but denies Si/hi or avh, Patient is safe and will be monitored.

## 2018-06-27 NOTE — ED Notes (Signed)
Pt discharged to home with family. Discharged instruction and work note given and verbalized understanding. Pt denied SI/HI. She is stable NAD VS WNL at discharge.

## 2018-06-27 NOTE — ED Notes (Signed)
First Nurse Note: Pt states that she is [redacted] weeks pregnant and having issues with her blood pressure. Pt also having some lower abd cramping.

## 2018-06-27 NOTE — ED Notes (Signed)
Per pa the pt states she is suicidal but pregnant. Dr Roxan Hockeyrobinson signed off for her to go to quad

## 2018-06-27 NOTE — ED Notes (Signed)
Fetal Heart tones 144 BPM

## 2018-07-19 ENCOUNTER — Other Ambulatory Visit: Payer: Self-pay | Admitting: Obstetrics & Gynecology

## 2018-07-19 ENCOUNTER — Ambulatory Visit
Admission: RE | Admit: 2018-07-19 | Discharge: 2018-07-19 | Disposition: A | Discharge status: Home / Self Care | Payer: Medicaid Other | Source: Ambulatory Visit | Attending: Obstetrics & Gynecology | Admitting: Obstetrics & Gynecology

## 2018-07-19 DIAGNOSIS — R2241 Localized swelling, mass and lump, right lower limb: Secondary | ICD-10-CM | POA: Diagnosis present

## 2018-07-19 DIAGNOSIS — I8001 Phlebitis and thrombophlebitis of superficial vessels of right lower extremity: Secondary | ICD-10-CM | POA: Insufficient documentation

## 2018-07-19 DIAGNOSIS — O9989 Other specified diseases and conditions complicating pregnancy, childbirth and the puerperium: Secondary | ICD-10-CM | POA: Insufficient documentation

## 2018-07-19 DIAGNOSIS — O2222 Superficial thrombophlebitis in pregnancy, second trimester: Secondary | ICD-10-CM | POA: Diagnosis not present

## 2018-07-19 DIAGNOSIS — Z86718 Personal history of other venous thrombosis and embolism: Secondary | ICD-10-CM

## 2018-08-05 ENCOUNTER — Ambulatory Visit
Admission: RE | Admit: 2018-08-05 | Discharge: 2018-08-05 | Disposition: A | Discharge status: Home / Self Care | Payer: Medicaid Other | Source: Ambulatory Visit | Attending: Obstetrics and Gynecology | Admitting: Obstetrics and Gynecology

## 2018-08-05 ENCOUNTER — Other Ambulatory Visit: Payer: Self-pay | Admitting: Obstetrics and Gynecology

## 2018-08-05 ENCOUNTER — Other Ambulatory Visit (HOSPITAL_COMMUNITY): Payer: Self-pay | Admitting: Obstetrics and Gynecology

## 2018-08-05 DIAGNOSIS — M79661 Pain in right lower leg: Secondary | ICD-10-CM

## 2018-08-08 ENCOUNTER — Emergency Department
Admission: EM | Admit: 2018-08-08 | Discharge: 2018-08-08 | Disposition: A | Discharge status: Home / Self Care | Payer: Medicaid Other | Attending: Emergency Medicine | Admitting: Emergency Medicine

## 2018-08-08 ENCOUNTER — Other Ambulatory Visit: Payer: Self-pay

## 2018-08-08 DIAGNOSIS — O10012 Pre-existing essential hypertension complicating pregnancy, second trimester: Secondary | ICD-10-CM | POA: Insufficient documentation

## 2018-08-08 DIAGNOSIS — S50811A Abrasion of right forearm, initial encounter: Secondary | ICD-10-CM | POA: Insufficient documentation

## 2018-08-08 DIAGNOSIS — Y9389 Activity, other specified: Secondary | ICD-10-CM | POA: Insufficient documentation

## 2018-08-08 DIAGNOSIS — O1212 Gestational proteinuria, second trimester: Secondary | ICD-10-CM | POA: Diagnosis not present

## 2018-08-08 DIAGNOSIS — R8271 Bacteriuria: Secondary | ICD-10-CM

## 2018-08-08 DIAGNOSIS — Y92019 Unspecified place in single-family (private) house as the place of occurrence of the external cause: Secondary | ICD-10-CM | POA: Insufficient documentation

## 2018-08-08 DIAGNOSIS — Z79899 Other long term (current) drug therapy: Secondary | ICD-10-CM | POA: Diagnosis not present

## 2018-08-08 DIAGNOSIS — O2342 Unspecified infection of urinary tract in pregnancy, second trimester: Secondary | ICD-10-CM | POA: Insufficient documentation

## 2018-08-08 DIAGNOSIS — T07XXXA Unspecified multiple injuries, initial encounter: Secondary | ICD-10-CM

## 2018-08-08 DIAGNOSIS — Y998 Other external cause status: Secondary | ICD-10-CM | POA: Diagnosis not present

## 2018-08-08 DIAGNOSIS — O1210 Gestational proteinuria, unspecified trimester: Secondary | ICD-10-CM

## 2018-08-08 DIAGNOSIS — T148XXA Other injury of unspecified body region, initial encounter: Secondary | ICD-10-CM | POA: Diagnosis not present

## 2018-08-08 DIAGNOSIS — Z3A19 19 weeks gestation of pregnancy: Secondary | ICD-10-CM | POA: Insufficient documentation

## 2018-08-08 DIAGNOSIS — E039 Hypothyroidism, unspecified: Secondary | ICD-10-CM | POA: Diagnosis not present

## 2018-08-08 DIAGNOSIS — O99282 Endocrine, nutritional and metabolic diseases complicating pregnancy, second trimester: Secondary | ICD-10-CM | POA: Insufficient documentation

## 2018-08-08 DIAGNOSIS — O9A212 Injury, poisoning and certain other consequences of external causes complicating pregnancy, second trimester: Secondary | ICD-10-CM | POA: Diagnosis present

## 2018-08-08 DIAGNOSIS — O9989 Other specified diseases and conditions complicating pregnancy, childbirth and the puerperium: Secondary | ICD-10-CM

## 2018-08-08 LAB — URINALYSIS, COMPLETE (UACMP) WITH MICROSCOPIC
Bilirubin Urine: NEGATIVE
GLUCOSE, UA: NEGATIVE mg/dL
Hgb urine dipstick: NEGATIVE
Ketones, ur: NEGATIVE mg/dL
Nitrite: NEGATIVE
PROTEIN: 100 mg/dL — AB
Specific Gravity, Urine: 1.02 (ref 1.005–1.030)
pH: 6 (ref 5.0–8.0)

## 2018-08-08 MED ORDER — NITROFURANTOIN MONOHYD MACRO 100 MG PO CAPS
100.0000 mg | ORAL_CAPSULE | Freq: Once | ORAL | Status: AC
  Administered 2018-08-08: 100 mg via ORAL
  Filled 2018-08-08: qty 1

## 2018-08-08 MED ORDER — OXYCODONE HCL 5 MG PO TABS
5.0000 mg | ORAL_TABLET | Freq: Once | ORAL | Status: AC
  Administered 2018-08-08: 5 mg via ORAL
  Filled 2018-08-08: qty 1

## 2018-08-08 MED ORDER — ACETAMINOPHEN 500 MG PO TABS
1000.0000 mg | ORAL_TABLET | Freq: Once | ORAL | Status: AC
  Administered 2018-08-08: 1000 mg via ORAL
  Filled 2018-08-08: qty 2

## 2018-08-08 MED ORDER — METOCLOPRAMIDE HCL 10 MG PO TABS: 10.0000 mg | ORAL_TABLET | Freq: Three times a day (TID) | ORAL | 0 refills | Status: DC | PRN

## 2018-08-08 MED ORDER — NITROFURANTOIN MONOHYD MACRO 100 MG PO CAPS: 100.0000 mg | ORAL_CAPSULE | Freq: Two times a day (BID) | ORAL | 0 refills | Status: AC

## 2018-08-08 NOTE — Discharge Instructions (Signed)
Take antibiotics as prescribed.  There were bacteria and protein in your urine.  Make sure to follow-up with your OB within the next week for reevaluation.  Return to the emergency room for severe abdominal pain, dizziness, vaginal bleeding, or any other symptoms concerning to you.
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
## 2018-08-08 NOTE — ED Notes (Signed)
Pt agreed to have brother at bedside

## 2018-08-08 NOTE — ED Notes (Signed)
FHT 136 by this RN.

## 2018-08-08 NOTE — ED Notes (Signed)
Mom at bedside.
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
## 2018-08-10 LAB — URINE CULTURE

## 2018-08-29 ENCOUNTER — Other Ambulatory Visit: Payer: Self-pay

## 2018-08-29 ENCOUNTER — Ambulatory Visit
Admission: EM | Admit: 2018-08-29 | Discharge: 2018-08-29 | Disposition: A | Discharge status: Home / Self Care | Payer: Medicaid Other | Attending: Family Medicine | Admitting: Family Medicine

## 2018-08-29 ENCOUNTER — Encounter: Payer: Self-pay | Admitting: Emergency Medicine

## 2018-08-29 DIAGNOSIS — B349 Viral infection, unspecified: Secondary | ICD-10-CM | POA: Diagnosis not present

## 2018-08-29 LAB — RAPID INFLUENZA A&B ANTIGENS: Influenza A (ARMC): NEGATIVE

## 2018-08-29 LAB — RAPID INFLUENZA A&B ANTIGENS (ARMC ONLY): INFLUENZA B (ARMC): NEGATIVE

## 2018-08-29 LAB — RAPID STREP SCREEN (MED CTR MEBANE ONLY): Streptococcus, Group A Screen (Direct): NEGATIVE

## 2018-08-29 MED ORDER — DOXYLAMINE-PYRIDOXINE 10-10 MG PO TBEC: DELAYED_RELEASE_TABLET | ORAL | 3 refills | Status: AC

## 2018-08-29 NOTE — ED Triage Notes (Signed)
Pt c/o headache, nausea, body aches, diarrhea, sweats, cough and dizziness. Started yesterday. Pt is also [redacted] weeks pregnant.

## 2018-08-29 NOTE — ED Provider Notes (Signed)
[facility]    CSN: [id number] Arrival date & time: 08/29/18  1323  History   Chief Complaint Chief Complaint  Patient presents with  . Generalized Body Aches    APPT   HPI  33 year old female who is currently [redacted] weeks pregnant presents with multiple complaints.  Patient reports that she has not been feeling well since yesterday.  She reports headache, nausea, body aches, cough, chills, dizziness, diarrhea.  No documented fever.  Feels very poorly.  No known exacerbating/relieving factors.  Patient concerned about possible influenza.  No other associated symptoms.  No other complaints this time.  PMH, Surgical Hx, Family Hx, Social History reviewed and updated as below.  Past Medical History:  Diagnosis Date  . Headache   . Hypertension   . Hypothyroidism    resolved  . Preeclampsia   . Preterm labor 2009   [redacted] wksga  . Preterm labor [id number]   [redacted] wksga  . Pulmonary edema    third trimester  . Thrombophlebitis     Patient Active Problem List   Diagnosis Date Noted  . IUFD at 20 weeks or more of gestation 11/04/2016  . Chronic hypertension during pregnancy, antepartum 09/24/2016  . Supervision of high risk pregnancy, antepartum 07/18/2016  . History of gestational diabetes mellitus (GDM) 07/18/2016  . Status post repeat low transverse cesarean section--due to severe pre-eclampsia 01/05/2016  . Hx of preeclampsia, prior pregnancy, currently pregnant 01/02/2016  . H/O placenta previa 11/29/2015  . H/O: C-section 11/29/2015  . H/O migraine (no meds) 11/29/2015  . Obesity (BMI 30-39.9) 11/29/2015  . Rubella non-immune status, antepartum 11/29/2015  . H/O gastroesophageal reflux (GERD) 11/29/2015  . Restless leg syndrome 11/29/2015    Past Surgical History:  Procedure Laterality Date  . CESAREAN SECTION    . CESAREAN SECTION N/A 01/04/2016   Procedure: CESAREAN SECTION;  Surgeon: Silverio LaySandra [name], MD;  [facility];  Service: Obstetrics;   Laterality: N/A;    OB History    Gravida  5   Para  4   Term  1   Preterm  3   AB      Living  3     SAB      TAB      Ectopic      Multiple  0   Live Births  3            Home Medications    Prior to Admission medications   Medication Sig Start Date End Date Taking? Authorizing Provider  aspirin EC 81 MG tablet Take by mouth. 06/15/18 08/20/19 Yes [provider]  labetalol (NORMODYNE) 200 MG tablet Take by mouth. 05/14/18  Yes [provider]  Prenatal Multivit-Min-Fe-FA (PRE-NATAL FORMULA PO) Take by mouth.   Yes [provider]  sertraline (ZOLOFT) 50 MG tablet Take 1 tablet (50 mg total) by mouth daily. 12/18/16  Yes Constant, Peggy, MD  Doxylamine-Pyridoxine 10-10 MG TBEC Two tablets at bedtime on day 1 and 2; if symptoms persist, take 1 tablet in morning and 2 tablets at bedtime on day 3; if symptoms persist, may increase to 1 tablet in morning, 1 tablet mid-afternoon, and 2 tablets at bedtime on day 4 08/29/18   Tommie Samsook, Annaleese Guier G, DO    Family History Family History  Problem Relation Age of Onset  . Hypertension Father   . Diabetes Maternal Grandmother     Social History Social History   Tobacco Use  . Smoking status: Never Smoker  .  Smokeless tobacco: Never Used  Substance Use Topics  . Alcohol use: No  . Drug use: No     Allergies   Patient has no known allergies.   Review of Systems Review of Systems Per HPI  Physical Exam Triage Vital Signs ED Triage Vitals  Enc Vitals Group     BP 08/29/18 1344 130/88     Pulse Rate 08/29/18 1344 (!) 101     Resp 08/29/18 1344 18     Temp 08/29/18 1344 98.5 F (36.9 C)     Temp Source 08/29/18 1344 Oral     SpO2 08/29/18 1344 98 %     Weight 08/29/18 1341 254 lb (115.2 kg)     Height 08/29/18 1341 5\' 6"  (1.676 m)     Head Circumference --      Peak Flow --      Pain Score 08/29/18 1340 10     Pain Loc --      Pain Edu? --      Excl. in GC? --    Updated Vital  Signs BP 130/88 (BP Location: Left Arm)   Pulse (!) 101   Temp 98.5 F (36.9 C) (Oral)   Resp 18   Ht 5\' 6"  (1.676 m)   Wt 115.2 kg   LMP 03/19/2018 (Exact Date)   SpO2 98%   BMI 41.00 kg/m   Visual Acuity Right Eye Distance:   Left Eye Distance:   Bilateral Distance:    Right Eye Near:   Left Eye Near:    Bilateral Near:     Physical Exam Vitals signs and nursing note reviewed.  Constitutional:      General: She is not in acute distress.    Appearance: Normal appearance.  HENT:     Head: Normocephalic and atraumatic.     Right Ear: Tympanic membrane normal.     Left Ear: Tympanic membrane normal.     Nose: Nose normal.     Mouth/Throat:     Pharynx: Oropharynx is clear. No posterior oropharyngeal erythema.  Eyes:     General:        Right eye: No discharge.        Left eye: No discharge.     Conjunctiva/sclera: Conjunctivae normal.  Cardiovascular:     Rate and Rhythm: Regular rhythm. Tachycardia present.  Pulmonary:     Effort: Pulmonary effort is normal.     Breath sounds: Normal breath sounds.  Neurological:     Mental Status: She is alert.  Psychiatric:        Mood and Affect: Mood normal.        Behavior: Behavior normal.    UC Treatments / Results  Labs (all labs ordered are listed, but only abnormal results are displayed) Labs Reviewed  RAPID INFLUENZA A&B ANTIGENS ([facility] ONLY)  RAPID STREP SCREEN ([facility] ONLY)  CULTURE, GROUP A STREP [facility])    EKG None  Radiology No results found.  Procedures Procedures (including critical care time)  Medications Ordered in UC Medications - No data to display  Initial Impression / Assessment and Plan / UC Course  I have reviewed the triage vital signs and the nursing notes.  Pertinent labs & imaging results that were available during my care of the patient were reviewed by me and considered in my medical decision making (see chart for details).    33 year old female presents with a  suspected viral illness.  Flu negative.  Advised rest and fluids.  Patient requested medication for nausea that is safe in pregnancy.  Placed on Diclegis.  Work note given.  Final Clinical Impressions(s) / UC Diagnoses   Final diagnoses:  Viral illness     Discharge Instructions     Rest.  Fluids.  Medication as prescribed.  Take care  Dr. Adriana Simas    ED Prescriptions    Medication Sig Dispense Auth. Provider   Doxylamine-Pyridoxine 10-10 MG TBEC Two tablets at bedtime on day 1 and 2; if symptoms persist, take 1 tablet in morning and 2 tablets at bedtime on day 3; if symptoms persist, may increase to 1 tablet in morning, 1 tablet mid-afternoon, and 2 tablets at bedtime on day 4 60 tablet Tommie Sams, DO     Controlled Substance Prescriptions [place] Controlled Substance Registry consulted? Not Applicable   Tommie Sams, DO 08/29/18 1721

## 2018-08-29 NOTE — Discharge Instructions (Signed)
Rest. Fluids.  Medication as prescribed.   Take care  Dr. Aubryana Vittorio  

## 2018-09-01 LAB — CULTURE, GROUP A STREP (THRC)

## 2018-09-02 ENCOUNTER — Encounter: Payer: Self-pay | Admitting: Emergency Medicine

## 2018-09-02 DIAGNOSIS — E039 Hypothyroidism, unspecified: Secondary | ICD-10-CM | POA: Diagnosis not present

## 2018-09-02 DIAGNOSIS — Z3A25 25 weeks gestation of pregnancy: Secondary | ICD-10-CM | POA: Insufficient documentation

## 2018-09-02 DIAGNOSIS — J209 Acute bronchitis, unspecified: Secondary | ICD-10-CM | POA: Diagnosis not present

## 2018-09-02 DIAGNOSIS — Z7982 Long term (current) use of aspirin: Secondary | ICD-10-CM | POA: Diagnosis not present

## 2018-09-02 DIAGNOSIS — Z3A23 23 weeks gestation of pregnancy: Secondary | ICD-10-CM | POA: Insufficient documentation

## 2018-09-02 DIAGNOSIS — Z79899 Other long term (current) drug therapy: Secondary | ICD-10-CM | POA: Insufficient documentation

## 2018-09-02 DIAGNOSIS — O10012 Pre-existing essential hypertension complicating pregnancy, second trimester: Secondary | ICD-10-CM | POA: Diagnosis not present

## 2018-09-02 DIAGNOSIS — O99282 Endocrine, nutritional and metabolic diseases complicating pregnancy, second trimester: Secondary | ICD-10-CM | POA: Insufficient documentation

## 2018-09-02 DIAGNOSIS — O99512 Diseases of the respiratory system complicating pregnancy, second trimester: Secondary | ICD-10-CM | POA: Insufficient documentation

## 2018-09-02 LAB — CBC
HCT: 39.2 % (ref 36.0–46.0)
Hemoglobin: 13 g/dL (ref 12.0–15.0)
MCH: 27.1 pg (ref 26.0–34.0)
MCHC: 33.2 g/dL (ref 30.0–36.0)
MCV: 81.7 fL (ref 80.0–100.0)
Platelets: 278 10*3/uL (ref 150–400)
RBC: 4.8 MIL/uL (ref 3.87–5.11)
RDW: 16.2 % — ABNORMAL HIGH (ref 11.5–15.5)
WBC: 8.9 10*3/uL (ref 4.0–10.5)
nRBC: 0 % (ref 0.0–0.2)

## 2018-09-02 LAB — BASIC METABOLIC PANEL
Anion gap: 10 (ref 5–15)
CO2: 17 mmol/L — ABNORMAL LOW (ref 22–32)
Calcium: 8.7 mg/dL — ABNORMAL LOW (ref 8.9–10.3)
Chloride: 108 mmol/L (ref 98–111)
Creatinine, Ser: 0.49 mg/dL (ref 0.44–1.00)
GFR calc Af Amer: >60 mL/min (ref >60)
GFR calc non Af Amer: >60 mL/min (ref >60)
Glucose, Bld: 136 mg/dL — ABNORMAL HIGH (ref 70–99)
Potassium: 3.3 mmol/L — ABNORMAL LOW (ref 3.5–5.1)
SODIUM: 135 mmol/L (ref 135–145)

## 2018-09-02 LAB — HCG, QUANTITATIVE, PREGNANCY: hCG, Beta Chain, Quant, S: 14985 m[IU]/mL — ABNORMAL HIGH (ref <5)

## 2018-09-02 LAB — TROPONIN I: Troponin I: <0.03 ng/mL (ref <0.03)

## 2018-09-02 NOTE — ED Triage Notes (Signed)
Pt diagnosed on Sunday with Influenza. Pt c/o of increased SOB. Pt is [redacted] weeks pregnant, high risk pregnancy.

## 2018-09-02 NOTE — ED Notes (Signed)
FETAL HEART TONES 149

## 2018-09-03 ENCOUNTER — Emergency Department: Payer: Medicaid Other

## 2018-09-03 ENCOUNTER — Emergency Department
Admission: EM | Admit: 2018-09-03 | Discharge: 2018-09-03 | Disposition: A | Discharge status: Home / Self Care | Payer: Medicaid Other | Attending: Emergency Medicine | Admitting: Emergency Medicine

## 2018-09-03 DIAGNOSIS — J209 Acute bronchitis, unspecified: Secondary | ICD-10-CM

## 2018-09-03 LAB — URINALYSIS, COMPLETE (UACMP) WITH MICROSCOPIC
Bilirubin Urine: NEGATIVE
Glucose, UA: NEGATIVE mg/dL
Hgb urine dipstick: NEGATIVE
Ketones, ur: 5 mg/dL — AB
Leukocytes, UA: NEGATIVE
Nitrite: NEGATIVE
Protein, ur: NEGATIVE mg/dL
Specific Gravity, Urine: 1.017 (ref 1.005–1.030)
pH: 5 (ref 5.0–8.0)

## 2018-09-03 MED ORDER — BENZONATATE 100 MG PO CAPS: 100.0000 mg | ORAL_CAPSULE | Freq: Three times a day (TID) | ORAL | 0 refills | Status: AC | PRN

## 2018-09-03 MED ORDER — SODIUM CHLORIDE 0.9 % IV BOLUS
1000.0000 mL | Freq: Once | INTRAVENOUS | Status: AC
  Administered 2018-09-03: 1000 mL via INTRAVENOUS

## 2018-09-03 MED ORDER — AZITHROMYCIN 500 MG PO TABS
500.0000 mg | ORAL_TABLET | Freq: Once | ORAL | Status: AC
  Administered 2018-09-03: 500 mg via ORAL
  Filled 2018-09-03: qty 1

## 2018-09-03 MED ORDER — AZITHROMYCIN 500 MG PO TABS: 500.0000 mg | ORAL_TABLET | Freq: Every day | ORAL | 0 refills | Status: DC

## 2018-09-03 MED ORDER — OXYCODONE-ACETAMINOPHEN 5-325 MG PO TABS
1.0000 | ORAL_TABLET | Freq: Once | ORAL | Status: AC
  Administered 2018-09-03: 1 via ORAL
  Filled 2018-09-03: qty 1

## 2018-09-03 MED ORDER — DIPHENHYDRAMINE HCL 50 MG/ML IJ SOLN
25.0000 mg | Freq: Once | INTRAMUSCULAR | Status: AC
  Administered 2018-09-03: 25 mg via INTRAVENOUS
  Filled 2018-09-03: qty 1

## 2018-09-03 MED ORDER — BENZONATATE 100 MG PO CAPS
100.0000 mg | ORAL_CAPSULE | Freq: Once | ORAL | Status: AC
  Administered 2018-09-03: 100 mg via ORAL
  Filled 2018-09-03: qty 1

## 2018-09-03 MED ORDER — AZITHROMYCIN 500 MG PO TABS: 500.0000 mg | ORAL_TABLET | Freq: Every day | ORAL | 0 refills | Status: AC

## 2018-09-03 MED ORDER — BENZONATATE 100 MG PO CAPS: 100.0000 mg | ORAL_CAPSULE | Freq: Three times a day (TID) | ORAL | 0 refills | Status: DC | PRN

## 2018-09-03 NOTE — ED Notes (Addendum)
Patient ambulatory to triage with steady gait, without difficulty; pt is tearful & agitated over long wait; Pt reports some lower abd pain and back pain; denies any vag bleeding or discharge, +movement; FHTs strong & regular at 160; st nonprod cough & congestion, chills since Sunday; strep neg & flu swab + on Sunday but not taking any meds; st unable to sleep due to coughing; st pain to right side abdomen with coughing; spoke with Dr Manson Passey and orders obtained for imaging; pt voices understanding of plan of care

## 2018-09-03 NOTE — ED Provider Notes (Addendum)
[facility] Emergency Department Provider Note  ____________________________________________   First MD Initiated Contact with Patient 09/03/18 0235     (approximate)  I have reviewed the triage vital signs and the nursing notes.   HISTORY  Chief Complaint Shortness of Breath  HPI Stephanie [name] is a 33 y.o. female approximately [redacted] weeks pregnant (1 previous miscarriage at 25 weeks previous pregnancy with preeclampsia) currently under the care of [facility] high risk presents to the emergency department with generalized body aches dyspnea, nasal congestion cough and subjective fevers at home following being diagnosed with the flu on Sunday at urgent care.  She states that she was offered Tamiflu by the urgent care physician however she refused because it was category C.  Patient states that she was improving and then subsequently symptoms worsened particularly her cough which brings her to the emergency department tonight.  Patient admits to right chest wall pain with coughing none without coughing.   Past Medical History:  Diagnosis Date  . Headache   . Hypertension   . Hypothyroidism    resolved  . Preeclampsia   . Preterm labor 2009   [redacted] wksga  . Preterm labor [id number]   [redacted] wksga  . Pulmonary edema    third trimester  . Thrombophlebitis     Patient Active Problem List   Diagnosis Date Noted  . IUFD at 20 weeks or more of gestation 11/04/2016  . Chronic hypertension during pregnancy, antepartum 09/24/2016  . Supervision of high risk pregnancy, antepartum 07/18/2016  . History of gestational diabetes mellitus (GDM) 07/18/2016  . Status post repeat low transverse cesarean section--due to severe pre-eclampsia 01/05/2016  . Hx of preeclampsia, prior pregnancy, currently pregnant 01/02/2016  . H/O placenta previa 11/29/2015  . H/O: C-section 11/29/2015  . H/O migraine (no meds) 11/29/2015  . Obesity (BMI 30-39.9) 11/29/2015  . Rubella non-immune  status, antepartum 11/29/2015  . H/O gastroesophageal reflux (GERD) 11/29/2015  . Restless leg syndrome 11/29/2015    Past Surgical History:  Procedure Laterality Date  . CESAREAN SECTION    . CESAREAN SECTION N/A 01/04/2016   Procedure: CESAREAN SECTION;  Surgeon: Silverio LaySandra [name], MD;  [facility];  Service: Obstetrics;  Laterality: N/A;    Prior to Admission medications   Medication Sig Start Date End Date Taking? Authorizing Provider  aspirin EC 81 MG tablet Take by mouth. 06/15/18 08/20/19  [provider]  azithromycin (ZITHROMAX) 500 MG tablet Take 1 tablet (500 mg total) by mouth daily for 3 days. Take 1 tablet daily for 3 days. 09/03/18 09/06/18  Darci CurrentBrown, Suring N, MD  benzonatate (TESSALON PERLES) 100 MG capsule Take 1 capsule (100 mg total) by mouth 3 (three) times daily as needed for cough. 09/03/18 09/03/19  Darci CurrentBrown, Aurora N, MD  Doxylamine-Pyridoxine 10-10 MG TBEC Two tablets at bedtime on day 1 and 2; if symptoms persist, take 1 tablet in morning and 2 tablets at bedtime on day 3; if symptoms persist, may increase to 1 tablet in morning, 1 tablet mid-afternoon, and 2 tablets at bedtime on day 4 08/29/18   Tommie Samsook, Jayce G, DO  labetalol (NORMODYNE) 200 MG tablet Take by mouth. 05/14/18   [provider]  Prenatal Multivit-Min-Fe-FA (PRE-NATAL FORMULA PO) Take by mouth.    [provider]  sertraline (ZOLOFT) 50 MG tablet Take 1 tablet (50 mg total) by mouth daily. 12/18/16   Constant, Peggy, MD    Allergies Patient has no known allergies.  Family History  Problem Relation Age of Onset  . Hypertension Father   . Diabetes Maternal Grandmother     Social History Social History   Tobacco Use  . Smoking status: Never Smoker  . Smokeless tobacco: Never Used  Substance Use Topics  . Alcohol use: No  . Drug use: No    Review of Systems Constitutional: No fever/chills.  Positive for generalized body aches Eyes: No visual changes. ENT: No  sore throat. Cardiovascular: Denies chest pain. Respiratory: Denies shortness of breath.  Positive for cough Gastrointestinal: No abdominal pain.  No nausea, no vomiting.  No diarrhea.  No constipation. Genitourinary: Negative for dysuria. Musculoskeletal: Negative for neck pain.  Negative for back pain. Integumentary: Negative for rash. Neurological: Negative for headaches, focal weakness or numbness.  ____________________________________________   PHYSICAL EXAM:  VITAL SIGNS: ED Triage Vitals [09/02/18 2009]  Enc Vitals Group     BP 129/73     Pulse Rate (!) 128     Resp (!) 26     Temp 98.7 F (37.1 C)     Temp Source Oral     SpO2 95 %     Weight      Height      Head Circumference      Peak Flow      Pain Score      Pain Loc      Pain Edu?      Excl. in GC?     Constitutional: Alert and oriented.  Coughing  eyes: Conjunctivae are normal.  Mouth/Throat: Mucous membranes are moist.  Oropharynx non-erythematous. Neck: No stridor.   Cardiovascular: Normal rate, regular rhythm. Good peripheral circulation. Grossly normal heart sounds. Respiratory: Normal respiratory effort.  No retractions. Lungs CTAB. Gastrointestinal: Soft and nontender. No distention.  Musculoskeletal: No lower extremity tenderness nor edema. No gross deformities of extremities. Neurologic:  Normal speech and language. No gross focal neurologic deficits are appreciated.  Skin:  Skin is warm, dry and intact. No rash noted. Psychiatric: Mood and affect are normal. Speech and behavior are normal.  ____________________________________________   LABS (all labs ordered are listed, but only abnormal results are displayed)  Labs Reviewed  BASIC METABOLIC PANEL - Abnormal; Notable for the following components:      Result Value   Potassium 3.3 (*)    CO2 17 (*)    Glucose, Bld 136 (*)    BUN <5 (*)    Calcium 8.7 (*)    All other components within normal limits  CBC - Abnormal; Notable for the  following components:   RDW 16.2 (*)    All other components within normal limits  HCG, QUANTITATIVE, PREGNANCY - Abnormal; Notable for the following components:   hCG, Beta Chain, Quant, S 14,985 (*)    All other components within normal limits  TROPONIN I  URINALYSIS, COMPLETE (UACMP) WITH MICROSCOPIC   _______________  RADIOLOGY I, Boaz N Misty Foutz, personally viewed and evaluated these images (plain radiographs) as part of my medical decision making, as well as reviewing the written report by the radiologist.  ED MD interpretation: Chest x-ray revealed central interstitial pattern suggesting bronchitis or airway disease no focal consolidation per radiologist.  OB ultrasound revealed single live intrauterine pregnancy 23 weeks 5 days  Official radiology report(s): Dg Chest 2 View  Result Date: 09/03/2018 CLINICAL DATA:  Cough and congestion. Chills since Sunday. EXAM: CHEST - 2 VIEW COMPARISON:  01/04/2016 FINDINGS: Shallow inspiration. Heart size and pulmonary vascularity are normal. Central interstitial pattern suggesting bronchitis  or airways disease. No blunting of costophrenic angles. No pneumothorax. No focal consolidation. Mediastinal contours appear intact. IMPRESSION: Central interstitial pattern suggesting bronchitis or airways disease. No focal consolidation. Electronically Signed   By: Burman Nieves M.D.   On: 09/03/2018 02:16   US Ob Limited  Result Date: 09/03/2018 CLINICAL DATA:  Lower abdominal and back pain for a few hours. EXAM: LIMITED OBSTETRIC ULTRASOUND FINDINGS: Number of Fetuses: 1 Heart Rate:  165 bpm Movement: Present per sonographer exam Presentation: Breech Placental Location: Posterior and fundal Previa: Absent Amniotic Fluid (Subjective):  Within normal limits. BPD: 5.79 cm 23 w  5 d this matches reported clinical dates MATERNAL FINDINGS: Cervix:  Appears closed. Uterus/Adnexae: No abnormality visualized. The ovaries were not seen. IMPRESSION: 1. Single  living intrauterine pregnancy measuring 23 weeks 5 days. No pathologic finding. 2. This exam is performed on an emergent basis and does not comprehensively evaluate fetal size, dating, or anatomy; follow-up complete OB US should be considered if further fetal assessment is warranted. Electronically Signed   By: Marnee Spring M.D.   On: 09/03/2018 04:36    ____________________________________________  ED ECG REPORT I, [place] N Sefora Tietje, the attending physician, personally viewed and interpreted this ECG.   Date: 09/02/2018  EKG Time: 4:59 PM  Rate: 68  Rhythm: Normal sinus rhythm  Axis: Normal  Intervals: Normal  ST&T Change: None   Procedures   ____________________________________________   INITIAL IMPRESSION / ASSESSMENT AND PLAN / ED COURSE  As part of my medical decision making, I reviewed the following data within the electronic MEDICAL RECORD NUMBER 33 year old female presented with above-stated history and physical exam secondary to cough chest discomfort with coughing and fever at home with recent diagnosis of influenza.  Reviewed the patient's chart and influenza swab was actually negative on 08/29/2018 however concern for the possibility of pneumonia and as such patient was shielded and chest x-ray performed which revealed evidence of bronchitis.  Suspect possible pneumonia that has not been detected on chest x-ray and as such patient given azithromycin in the emergency department will be prescribed the same for home.  Patient requested something stronger than Tylenol for headache as her headache was unrelieved with Tylenol here in the emergency department.  As such patient given Percocet we discussed the fact that this is a category C drug and what that meant and the patient requested it ____________________________________________  FINAL CLINICAL IMPRESSION(S) / ED DIAGNOSES  Final diagnoses:  Acute bronchitis, unspecified organism     MEDICATIONS GIVEN DURING THIS  VISIT:  Medications  sodium chloride 0.9 % bolus 1,000 mL (0 mLs Intravenous Stopped 09/03/18 0618)  benzonatate (TESSALON) capsule 100 mg (100 mg Oral Given 09/03/18 0318)  azithromycin (ZITHROMAX) tablet 500 mg (500 mg Oral Given 09/03/18 0318)  diphenhydrAMINE (BENADRYL) injection 25 mg (25 mg Intravenous Given 09/03/18 0327)  oxyCODONE-acetaminophen (PERCOCET/ROXICET) 5-325 MG per tablet 1 tablet (1 tablet Oral Given 09/03/18 0617)     ED Discharge Orders         Ordered    azithromycin (ZITHROMAX) 500 MG tablet  Daily     09/03/18 0646    benzonatate (TESSALON PERLES) 100 MG capsule  3 times daily PRN     09/03/18 0646           Note:  This document was prepared using Dragon voice recognition software and may include unintentional dictation errors.   Darci Current, MD 09/03/18 2359    Darci Current, MD 09/23/18 [phone]

## 2018-09-03 NOTE — ED Notes (Signed)
Pt alert and oriented X4, active, cooperative, pt in NAD. RR even and unlabored, color WNL.  Pt informed to return if any life threatening symptoms occur.  Discharge and followup instructions reviewed. Ambulates safely. 

## 2019-08-31 IMAGING — MR MR PELVIS W/O CM
9 of 10 series · 35 of 48 positions shown · non-contrast
Comparison: Ultrasound 9489

CLINICAL DATA: Abdominal pain. Concern for appendicitis. Potential
early pregnancy.

EXAM:
MRI ABDOMEN AND PELVIS WITHOUT CONTRAST
TECHNIQUE: Multiplanar multisequence MR imaging of the abdomen and pelvis was
performed. No intravenous contrast was administered.

[Series 5: cor haste · coronal · 6.0mm · 1.41mm/px · 2 of 34 slices shown]
[im 1/34]
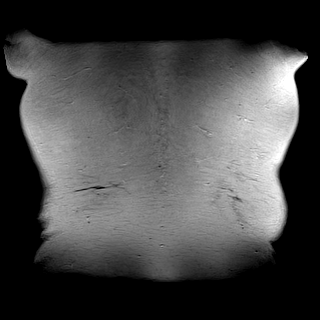
[im 34/34]
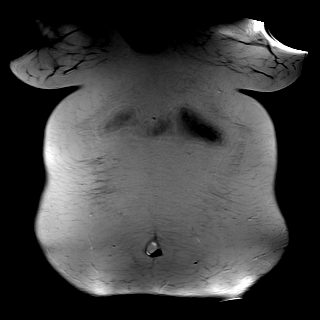

[Series 8: cor haste fs · coronal · 6.0mm · 1.41mm/px · 2 of 34 slices shown (1 of 2)]
[im 1/34]
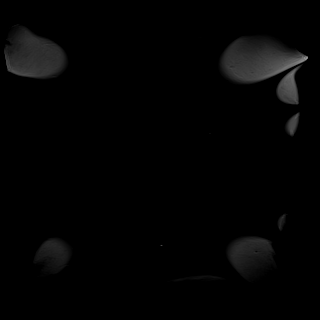
[im 34/34]
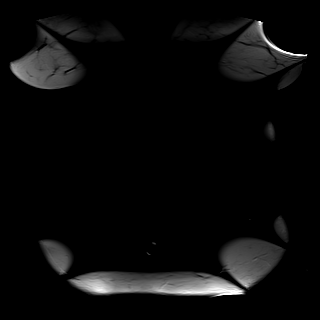

[Series 10: bSSFP · coronal · 6.0mm · 0.88mm/px · 3 of 34 slices shown (1 of 3)]
[im 1/34]
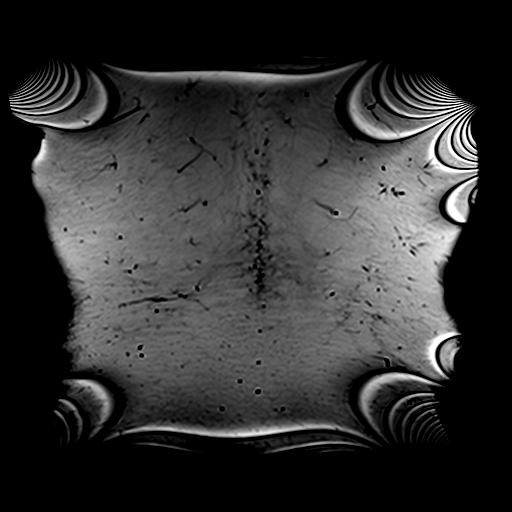
[im 17/34]
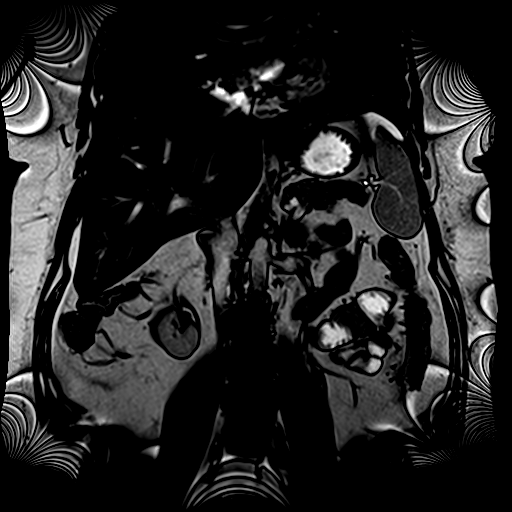
[im 34/34]
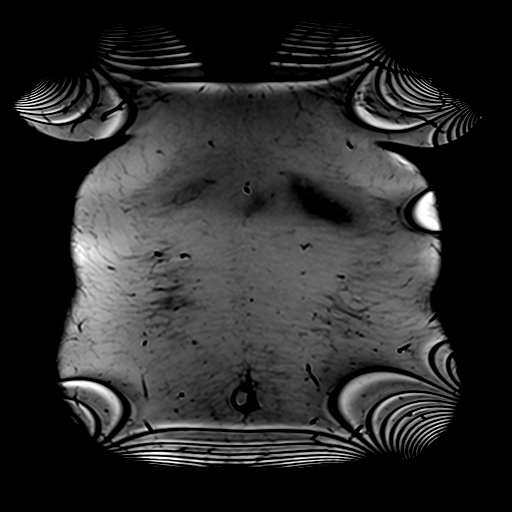

[Series 11: cor haste pelvis · coronal · 6.0mm · 1.41mm/px · 3 of 34 slices shown]
[im 1/34]
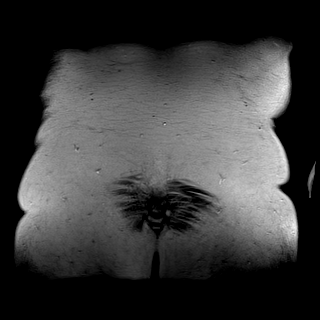
[im 17/34]
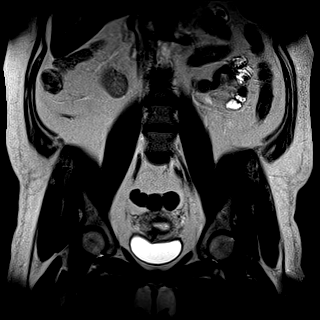
[im 34/34]
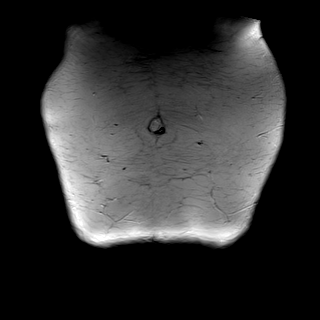

[Series 12: cor haste fs · coronal · 6.0mm · 1.41mm/px · 3 of 34 slices shown (2 of 2)]
[im 1/34]
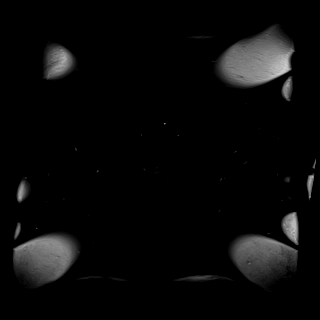
[im 17/34]
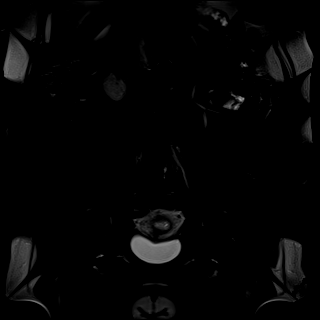
[im 34/34]
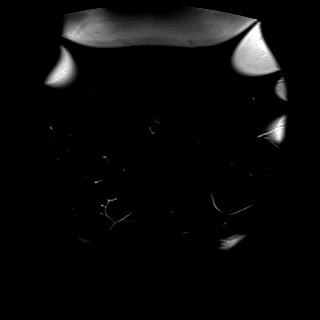

[Series 13: bSSFP · coronal · 6.0mm · 0.88mm/px · 3 of 34 slices shown (2 of 3)]
[im 1/34]
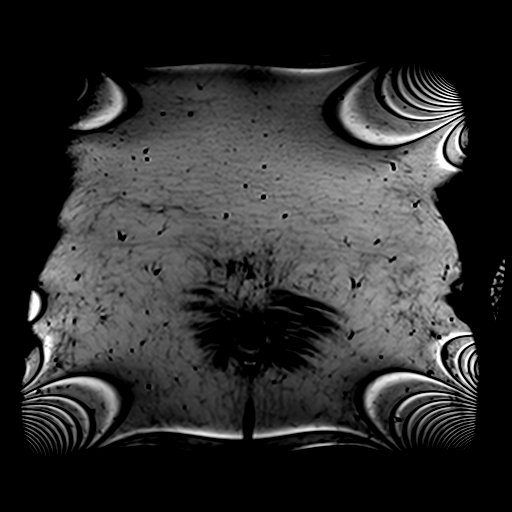
[im 17/34]
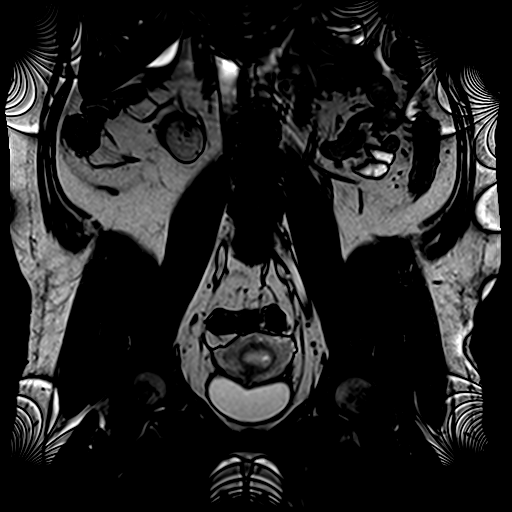
[im 34/34]
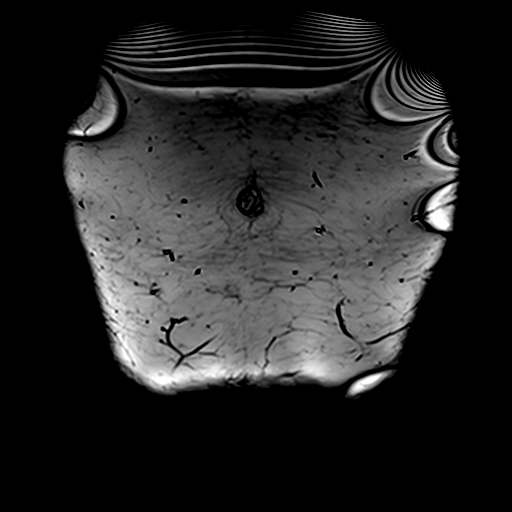

[Series 18: axial haste_comp · axial · 5.0mm · 1.19mm/px · z∈[-386,+182]mm · 7 of 96 slices shown]
[im 1/96]
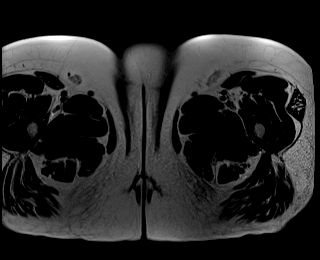
[im 16/96]
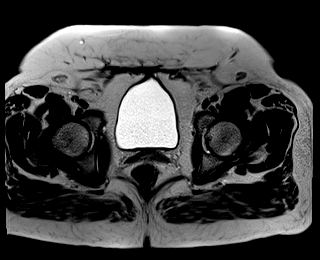
[im 32/96]
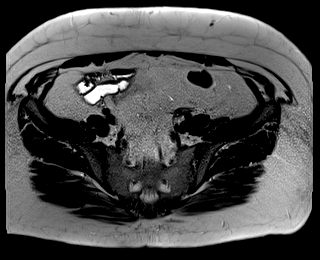
[im 48/96]
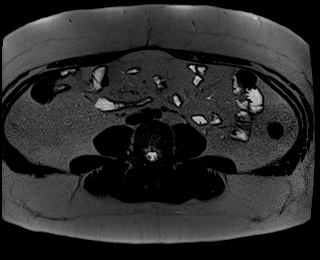
[im 64/96]
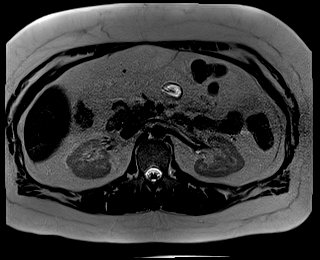
[im 80/96]
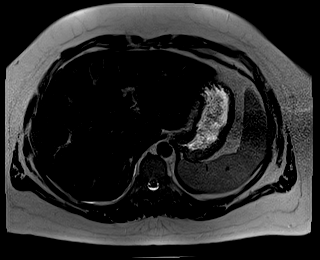
[im 96/96]
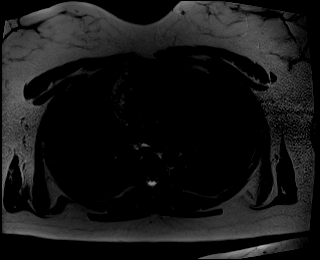

[Series 23: axial haste fs_comp · axial · 5.0mm · 1.19mm/px · z∈[-386,+182]mm · 7 of 96 slices shown]
[im 1/96]
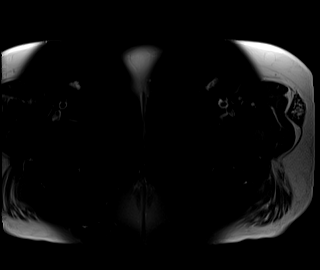
[im 16/96]
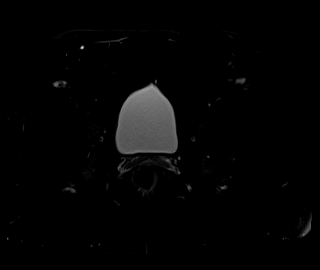
[im 32/96]
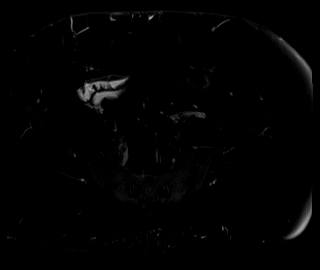
[im 48/96]
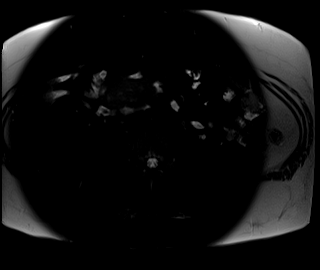
[im 64/96]
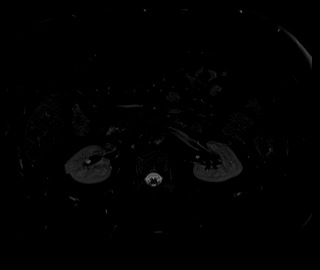
[im 80/96]
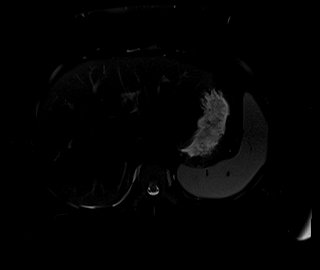
[im 96/96]
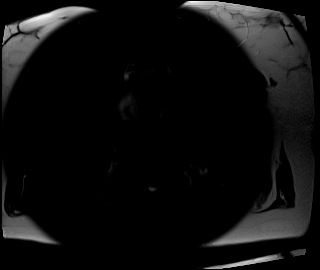

[Series 28: bSSFP · axial · 5.0mm · 0.74mm/px · z∈[-354,+26]mm · 5 of 82 slices shown (3 of 3)]
[im 1/82]
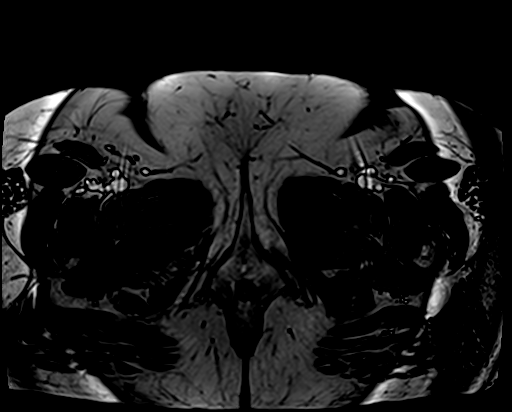
[im 17/82]
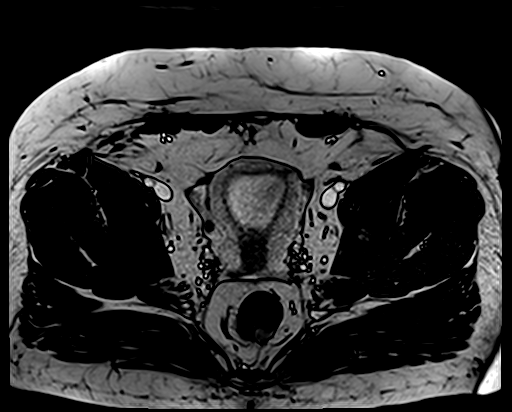
[im 33/82]
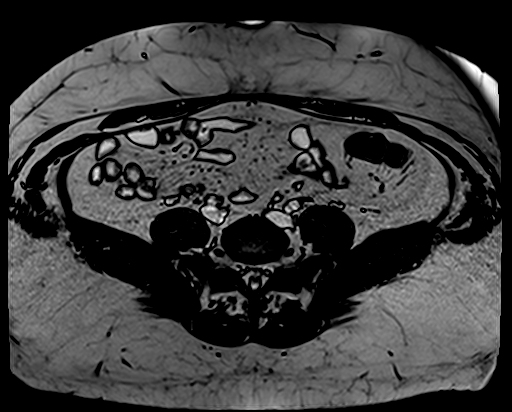
[im 49/82]
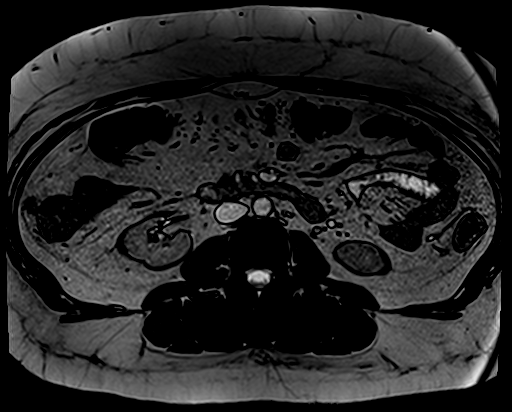
[im 65/82]
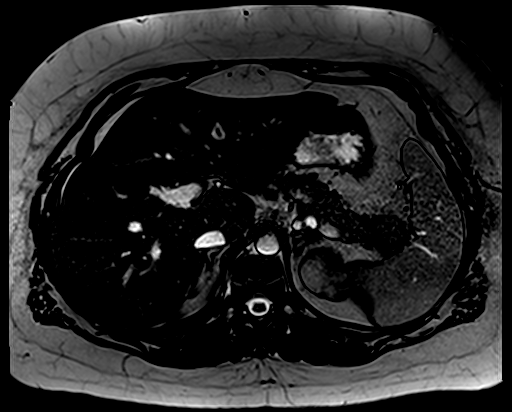

[35 of 48 positions shown; findings below may reference images not displayed]

FINDINGS: COMBINED FINDINGS FOR BOTH MR ABDOMEN AND PELVIS

Lower chest: Lung bases are clear.

Hepatobiliary: No focal hepatic lesion. No biliary duct dilatation.
Gallbladder is normal. Common bile duct is normal.

Pancreas: No pancreatic duct dilatation. No pancreatic inflammation.
Tiny 4 mm cyst along the body the pancreas. No communication with
the pancreatic duct.

Spleen: Normal spleen

Adrenals/urinary tract: Adrenal glands and kidneys are normal. The
ureters and bladder normal.

Stomach/Bowel: Stomach, small-bowel common terminal ileum are
normal. Normal appendix is identified in the RIGHT lower quadrant
(image [DATE]). No pericecal inflammation. No appendix dilatation.

The ascending, transverse, and descending colon are normal.

Vascular/Lymphatic: Abdominal aorta is normal caliber. No periportal
or retroperitoneal adenopathy. No pelvic adenopathy.

Reproductive: Uterus is normal. Ovaries are normal size without
inflammation. RIGHT ovary measures 3.4 x 2.5 cm. LEFT ovary measures
1.7 by 1.7 cm

Other: No free fluid the pelvis. No free fluid in the abdomen
pelvis.

Musculoskeletal: No aggressive osseous lesion.
IMPRESSION: 1. Normal appendix.
2. Normal uterus and ovaries.
3. Normal gallbladder.
4. Normal kidneys.
